# Patient Record
Sex: Male | Born: 1965 | Race: White | Hispanic: No | Smoking: Former smoker
Health system: Southern US, Community
[De-identification: ages and names within clinical notes are randomized; demographics above are authoritative.]

## PROBLEM LIST (undated history)

## (undated) DIAGNOSIS — E119 Type 2 diabetes mellitus without complications: Secondary | ICD-10-CM

## (undated) DIAGNOSIS — N529 Male erectile dysfunction, unspecified: Secondary | ICD-10-CM

## (undated) DIAGNOSIS — E785 Hyperlipidemia, unspecified: Secondary | ICD-10-CM

## (undated) DIAGNOSIS — I10 Essential (primary) hypertension: Secondary | ICD-10-CM

## (undated) DIAGNOSIS — C61 Malignant neoplasm of prostate: Secondary | ICD-10-CM

## (undated) DIAGNOSIS — D649 Anemia, unspecified: Secondary | ICD-10-CM

## (undated) DIAGNOSIS — K219 Gastro-esophageal reflux disease without esophagitis: Secondary | ICD-10-CM

## (undated) HISTORY — PX: MOUTH SURGERY: SHX715

## (undated) HISTORY — PX: HEMORROIDECTOMY: SUR656

---

## 2011-06-16 ENCOUNTER — Ambulatory Visit: Payer: Self-pay | Admitting: Surgery

## 2017-02-15 ENCOUNTER — Encounter: Payer: Self-pay | Admitting: Emergency Medicine

## 2017-02-15 ENCOUNTER — Emergency Department: Payer: Worker's Compensation

## 2017-02-15 ENCOUNTER — Emergency Department
Admission: EM | Admit: 2017-02-15 | Discharge: 2017-02-15 | Disposition: A | Discharge status: Home / Self Care | Payer: Worker's Compensation | Attending: Emergency Medicine | Admitting: Emergency Medicine

## 2017-02-15 DIAGNOSIS — S0990XA Unspecified injury of head, initial encounter: Secondary | ICD-10-CM | POA: Diagnosis present

## 2017-02-15 DIAGNOSIS — Y929 Unspecified place or not applicable: Secondary | ICD-10-CM | POA: Insufficient documentation

## 2017-02-15 DIAGNOSIS — S0083XA Contusion of other part of head, initial encounter: Secondary | ICD-10-CM | POA: Diagnosis not present

## 2017-02-15 DIAGNOSIS — W228XXA Striking against or struck by other objects, initial encounter: Secondary | ICD-10-CM | POA: Diagnosis not present

## 2017-02-15 DIAGNOSIS — E119 Type 2 diabetes mellitus without complications: Secondary | ICD-10-CM | POA: Insufficient documentation

## 2017-02-15 DIAGNOSIS — I10 Essential (primary) hypertension: Secondary | ICD-10-CM | POA: Diagnosis not present

## 2017-02-15 DIAGNOSIS — F17200 Nicotine dependence, unspecified, uncomplicated: Secondary | ICD-10-CM | POA: Diagnosis not present

## 2017-02-15 DIAGNOSIS — S0081XA Abrasion of other part of head, initial encounter: Secondary | ICD-10-CM | POA: Diagnosis not present

## 2017-02-15 DIAGNOSIS — Y990 Civilian activity done for income or pay: Secondary | ICD-10-CM | POA: Diagnosis not present

## 2017-02-15 DIAGNOSIS — Y9389 Activity, other specified: Secondary | ICD-10-CM | POA: Insufficient documentation

## 2017-02-15 HISTORY — DX: Essential (primary) hypertension: I10

## 2017-02-15 HISTORY — DX: Type 2 diabetes mellitus without complications: E11.9

## 2017-02-15 MED ORDER — HYDROCODONE-ACETAMINOPHEN 5-325 MG PO TABS: 1.0000 | ORAL_TABLET | ORAL | 0 refills | Status: DC | PRN

## 2017-02-15 NOTE — ED Notes (Signed)
See triage note. States he was hit in the head with heavy chain at work  Swelling noted to forehead with some small abrasions noted

## 2017-02-15 NOTE — Discharge Instructions (Signed)
Continue to use ice to your forehead to reduce swelling. Watch patient's abrasions for infection. Take Tylenol tonight as needed for headache or pain. Read information about head injuries and also have the persons staying with you read about head injuries also. Return to the emergency room immediately if any worsening of your symptoms such as slurred speech, projectile vomiting, visual changes, no change in personality. You may take Norco if needed for severe pain beginning tomorrow if needed.

## 2017-02-15 NOTE — ED Triage Notes (Addendum)
Pt was hit in head by large chain. Pt reports chain had pressure/tnesion on it when came and hit him.    Swelling noted to forehead.  Did have some nausea, no vomiting.  Small open wound to forehead with bleeding controlled.  No LOC per pt.  Felt like "woozy" at time of event but feels better now.  Was ambulatory after

## 2017-02-15 NOTE — ED Provider Notes (Signed)
[facility] Emergency Department Provider Note  ____________________________________________   First MD Initiated Contact with Patient 02/15/17 1304     (approximate)  I have reviewed the triage vital signs and the nursing notes.   HISTORY  Chief Complaint Head Injury    HPI Kevin Holder is a 51 y.o. male is here after being injured at work. Patient states that he was hit in the head with a large chain that broke. He states that they were lifting equipment with this chain when it snapped and hit him. He denies any loss of consciousness. He denies any nausea, vomiting or visual changes. He states initially he felt "woozy" but has started feeling better. He does have some open wounds on his face. He has had a tetanus shot within last 3 years. Patient rates pain as a 5/10.    Past Medical History:  Diagnosis Date  . Diabetes mellitus without complication ([place])   . Hypertension     There are no active problems to display for this patient.   Past Surgical History:  Procedure Laterality Date  . HEMORROIDECTOMY      Prior to Admission medications   Medication Sig Start Date End Date Taking? Authorizing Provider  HYDROcodone-acetaminophen (NORCO/VICODIN) 5-325 MG tablet Take 1 tablet by mouth every 4 (four) hours as needed for moderate pain. 02/15/17   Johnn Hai, PA-C    Allergies Patient has no known allergies.  History reviewed. No pertinent family history.  Social History Social History  Substance Use Topics  . Smoking status: Current Some Day Smoker  . Smokeless tobacco: Current User  . Alcohol use Yes    Review of Systems Constitutional: No fever/chills Eyes: No visual changes. ENT: Abrasions to the nose. No visual changes. Cardiovascular: Denies chest pain. Respiratory: Denies shortness of breath. Gastrointestinal:   No nausea, no vomiting.   Skin: Positive abrasion forehead and bridge of the nose. Neurological: Negative  for headaches, focal weakness or numbness.   ____________________________________________   PHYSICAL EXAM:  VITAL SIGNS: ED Triage Vitals  Enc Vitals Group     BP 02/15/17 1147 (!) 152/82     Pulse Rate 02/15/17 1147 84     Resp 02/15/17 1147 18     Temp 02/15/17 1147 99 F (37.2 C)     Temp Source 02/15/17 1147 Oral     SpO2 02/15/17 1147 99 %     Weight 02/15/17 1148 240 lb (108.9 kg)     Height 02/15/17 1148 6\' 1"  (1.854 m)     Head Circumference --      Peak Flow --      Pain Score 02/15/17 1147 5     Pain Loc --      Pain Edu? --      Excl. in [place]? --     Constitutional: Alert and oriented. Well appearing and in no acute distress. Eyes: Conjunctivae are normal. PERRL. EOMI. Head: Atraumatic. Nose: No congestion/rhinnorhea. Neck: No stridor.  No tenderness on palpation cervical spine. Cardiovascular: Normal rate, regular rhythm. Grossly normal heart sounds.  Good peripheral circulation. Respiratory: Normal respiratory effort.  No retractions. Lungs CTAB. Musculoskeletal: Moves upper and lower extremities without difficulty and patient was ambulatory while in the department. Neurologic:  Normal speech and language. No gross focal neurologic deficits are appreciated. No gait instability. Skin:  Skin is warm, dry.  There are several superficial linear abrasions across the bridge of the nose without any active bleeding. There is one vertical  abrasion measuring approximately 1 cm on the left forehead with soft tissue swelling. No active bleeding was noted. Psychiatric: Mood and affect are normal. Speech and behavior are normal.  ____________________________________________   LABS (all labs ordered are listed, but only abnormal results are displayed)  Labs Reviewed - No data to display ____________________________________________   RADIOLOGY  Ct Head Wo Contrast  Result Date: 02/15/2017 CLINICAL DATA:  Struck in the head by a chain.  Forehead swelling. EXAM: CT HEAD  WITHOUT CONTRAST TECHNIQUE: Contiguous axial images were obtained from the base of the skull through the vertex without intravenous contrast. COMPARISON:  None. FINDINGS: Brain: No evidence of malformation, atrophy, old or acute small or large vessel infarction, mass lesion, hemorrhage, hydrocephalus or extra-axial collection. No evidence of pituitary lesion. Vascular: No vascular calcification.  No hyperdense vessels. Skull: Normal.  No fracture or focal bone lesion. Sinuses/Orbits: Visualized sinuses are clear. No fluid in the middle ears or mastoids. Visualized orbits are normal. Other: Pronounced forehead soft tissue swelling but without underlying calvarial abnormality. IMPRESSION: Pronounced forehead soft tissue swelling. No underlying skull fracture or fluid in the sinuses. No intracranial abnormality. No radiopaque foreign object. Electronically Signed   By: Nelson Chimes M.D.   On: 02/15/2017 12:29    ____________________________________________   PROCEDURES  Procedure(s) performed: None  Procedures  Critical Care performed: No  ____________________________________________   INITIAL IMPRESSION / ASSESSMENT AND PLAN / ED COURSE  Pertinent labs & imaging results that were available during my care of the patient were reviewed by me and considered in my medical decision making (see chart for details).  Discussed head injuries with patient and family. Patient is to take Tylenol if needed for headache for today. His continue using ice to reduce swelling on his forehead. He is watching abrasions for signs of infection. He was given a prescription for Norco if needed for pain or headache tomorrow. We discussed symptoms and signs for immediate return to the emergency room tonight if any problems. Patient was given a note for light duty for the next several days. CT scan was reassuring and patient was made aware.      ____________________________________________   FINAL CLINICAL  IMPRESSION(S) / ED DIAGNOSES  Final diagnoses:  Contusion of forehead, initial encounter  Abrasion of face, initial encounter      NEW MEDICATIONS STARTED DURING THIS VISIT:  Discharge Medication List as of 02/15/2017  1:39 PM    START taking these medications   Details  HYDROcodone-acetaminophen (NORCO/VICODIN) 5-325 MG tablet Take 1 tablet by mouth every 4 (four) hours as needed for moderate pain., Starting Tue 02/15/2017, Print         Note:  This document was prepared using Dragon voice recognition software and may include unintentional dictation errors.    Johnn Hai, PA-C 02/15/17 1543    Earleen Newport, MD 02/17/17 [phone]

## 2018-08-05 IMAGING — CT CT HEAD W/O CM
3 series · 15 of 47 positions shown, 18 images · non-contrast
Comparison: None.

CLINICAL DATA: Struck in the head by a chain.  Forehead swelling.

EXAM:
CT HEAD WITHOUT CONTRAST
TECHNIQUE: Contiguous axial images were obtained from the base of the skull
through the vertex without intravenous contrast.

[Series 2: head wo · axial · 0.47mm/px · z∈[+255,+390]mm · 9 of 33 slices shown, 12 images]
[im 3/33  brain]
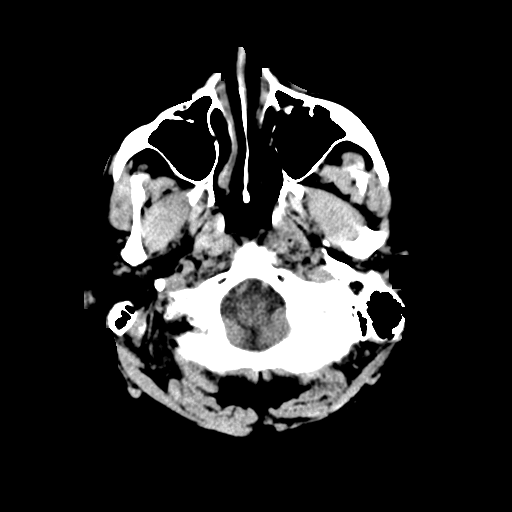
[im 3/33  bone]
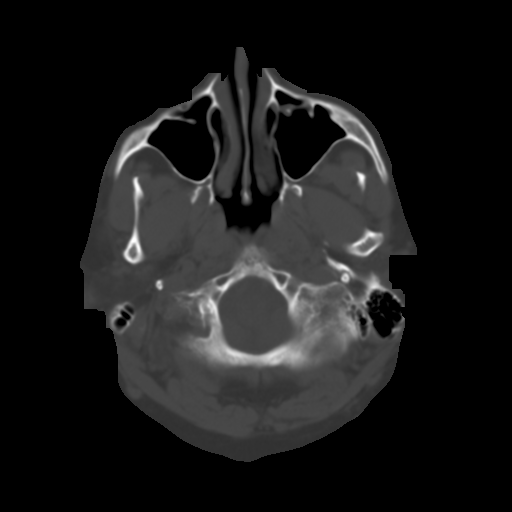
[im 6/33  brain]
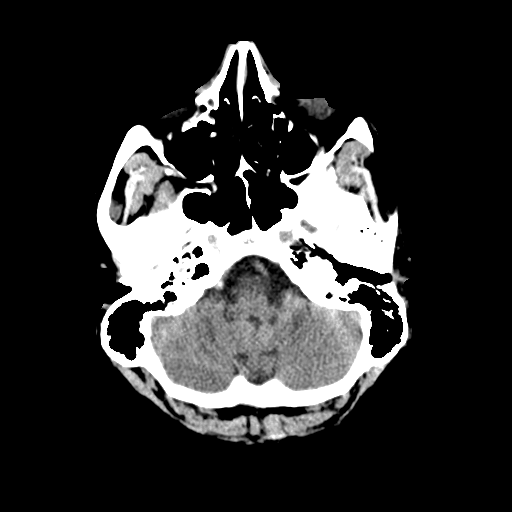
[im 9/33  brain]
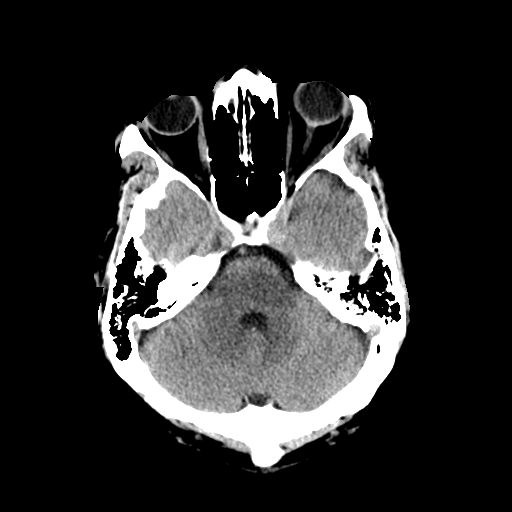
[im 13/33  brain]
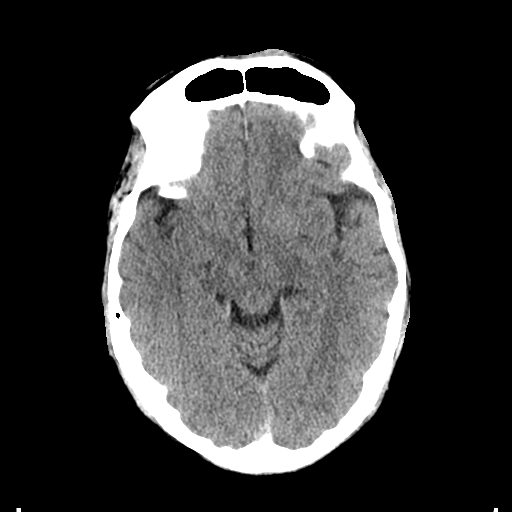
[im 17/33  brain]
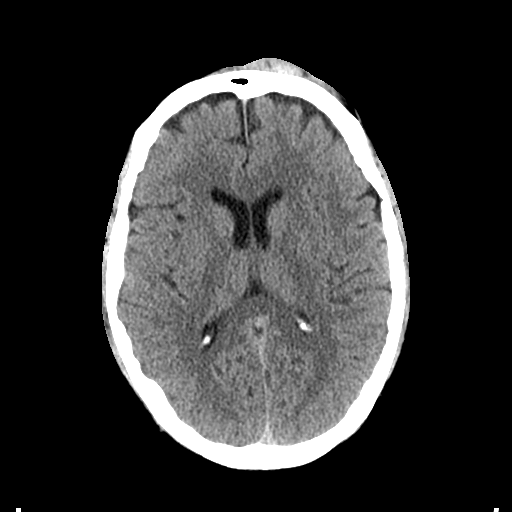
[im 17/33  bone]
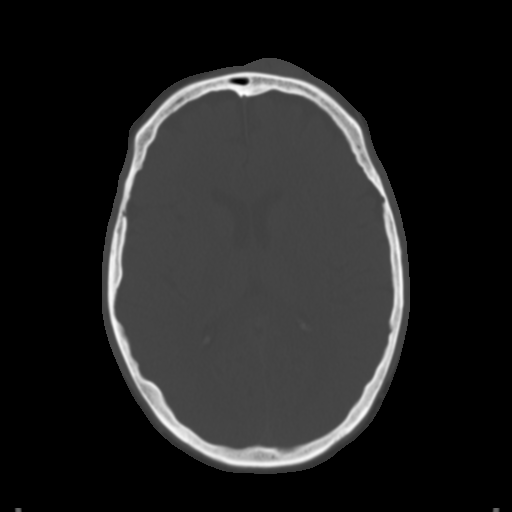
[im 20/33  brain]
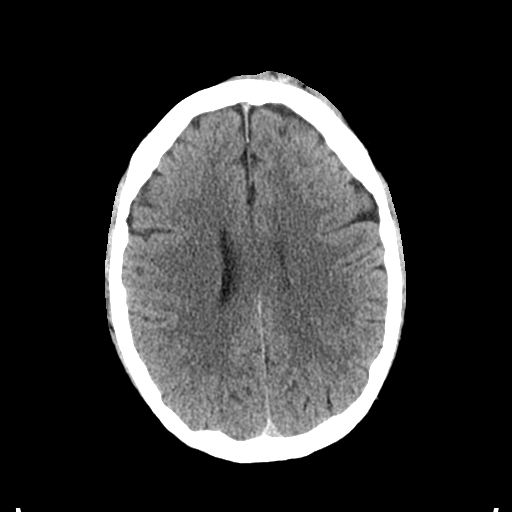
[im 24/33  brain]
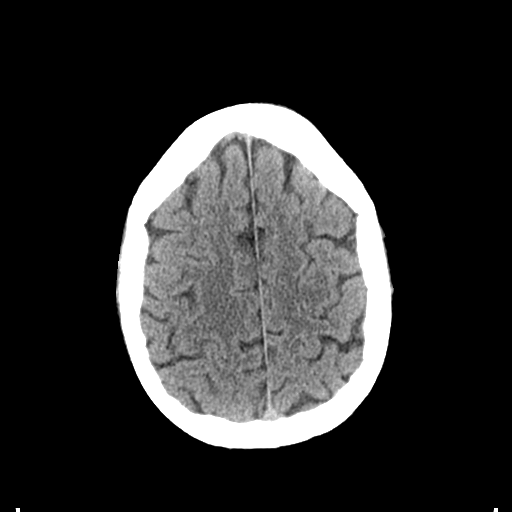
[im 27/33  brain]
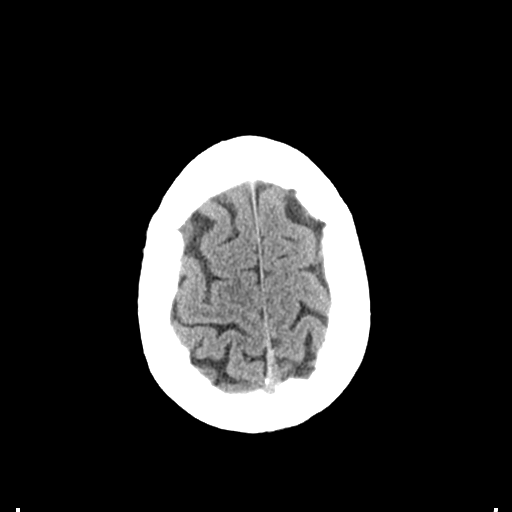
[im 30/33  brain]
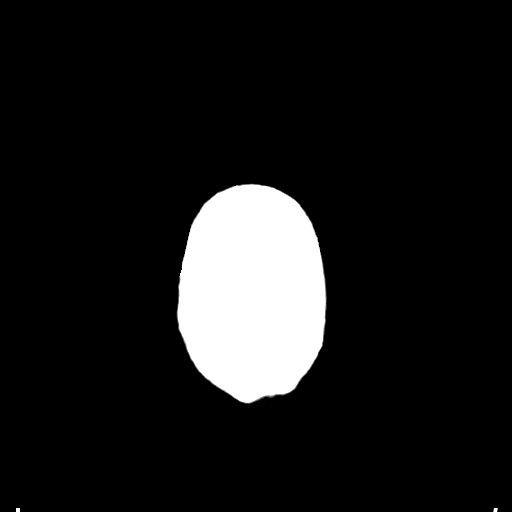
[im 30/33  bone]
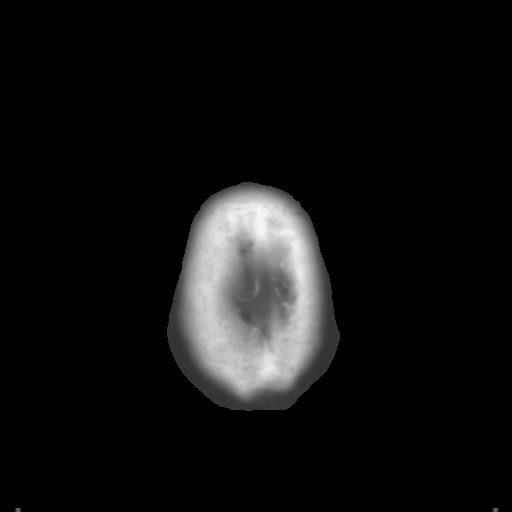

[Series 4: coronal soft tissue · coronal · 0.33mm/px · 3 of 70 slices shown]
[im 24/70  brain]
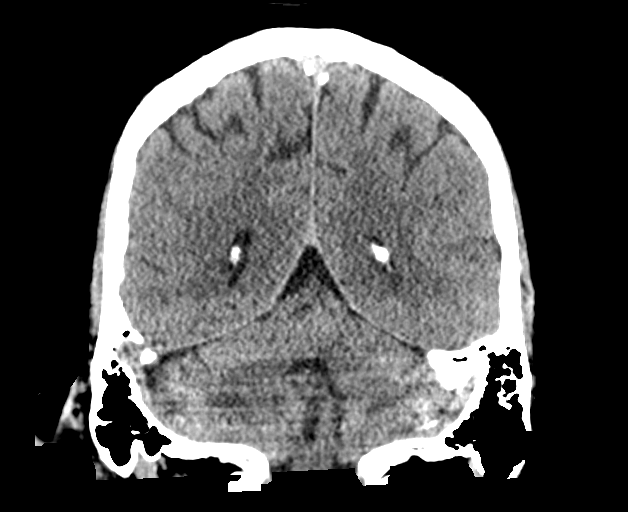
[im 31/70  brain]
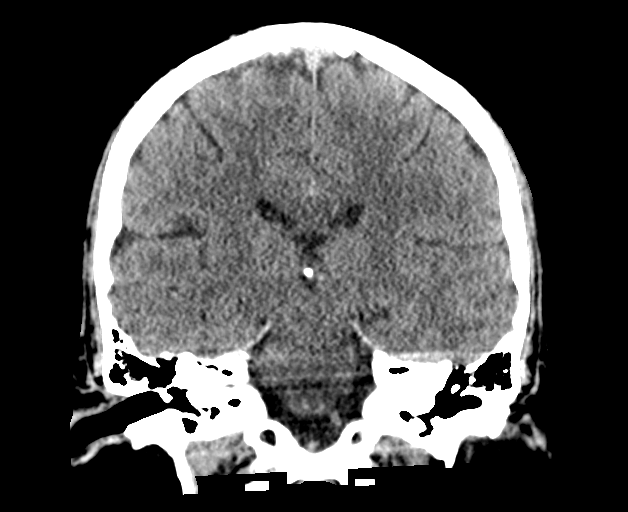
[im 39/70  brain]
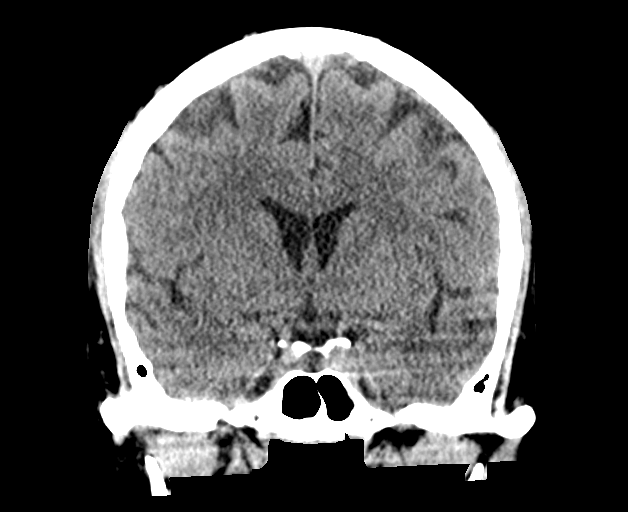

[Series 5: sagittal soft tissue · sagittal · 0.33mm/px · 3 of 52 slices shown]
[im 18/52  brain]
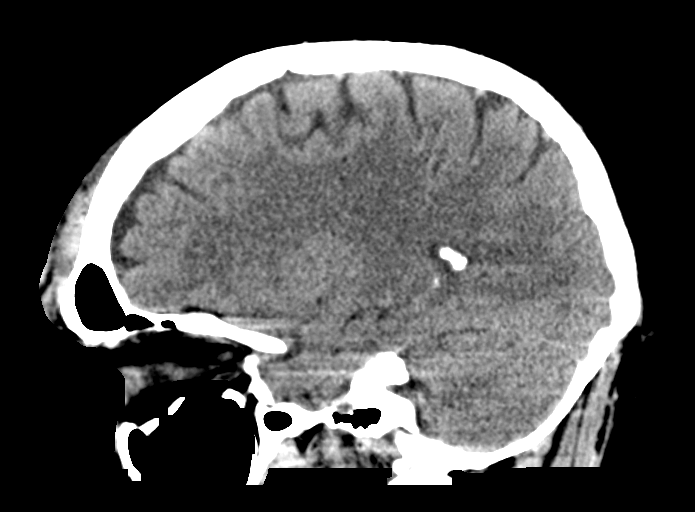
[im 26/52  brain]
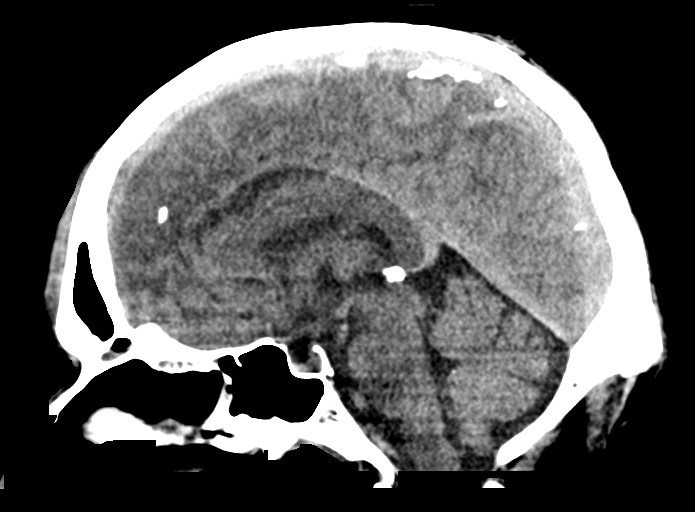
[im 35/52  brain]
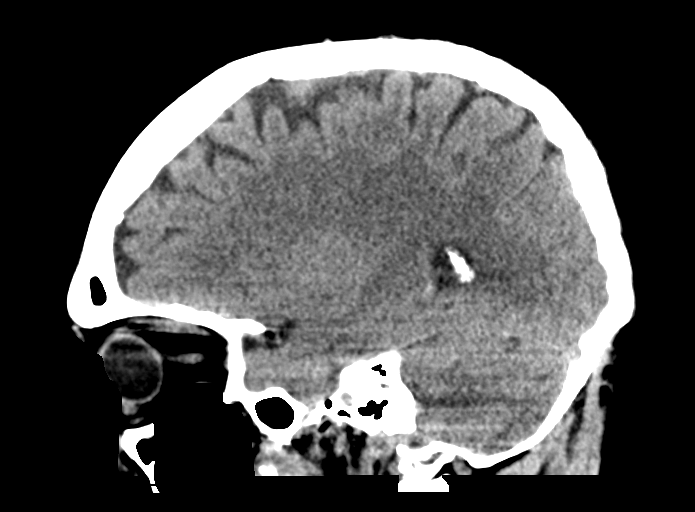

[15 of 47 positions shown; findings below may reference images not displayed]

FINDINGS: Brain: No evidence of malformation, atrophy, old or acute small or
large vessel infarction, mass lesion, hemorrhage, hydrocephalus or
extra-axial collection. No evidence of pituitary lesion.

Vascular: No vascular calcification.  No hyperdense vessels.

Skull: Normal.  No fracture or focal bone lesion.

Sinuses/Orbits: Visualized sinuses are clear. No fluid in the middle
ears or mastoids. Visualized orbits are normal.

Other: Pronounced forehead soft tissue swelling but without
underlying calvarial abnormality.
IMPRESSION: Pronounced forehead soft tissue swelling. No underlying skull
fracture or fluid in the sinuses. No intracranial abnormality. No
radiopaque foreign object.

## 2019-11-08 ENCOUNTER — Ambulatory Visit: Payer: Self-pay | Attending: Internal Medicine

## 2019-11-08 DIAGNOSIS — Z23 Encounter for immunization: Secondary | ICD-10-CM

## 2019-11-08 NOTE — Progress Notes (Signed)
   Covid-19 Vaccination Clinic  Name:  Kevin Holder    MRN: [id number] DOB: 07/26/1966  11/08/2019  Mr. Kevin Holder was observed post Covid-19 immunization for 15 minutes without incident. He was provided with Vaccine Information Sheet and instruction to access the V-Safe system.   Mr. Kevin Holder was instructed to call 911 with any severe reactions post vaccine: Marland Kitchen Difficulty breathing  . Swelling of face and throat  . A fast heartbeat  . A bad rash all over body  . Dizziness and weakness   Immunizations Administered    Name Date Dose VIS Date Route   Pfizer COVID-19 Vaccine 11/08/2019  9:41 AM 0.3 mL 08/03/2019 Intramuscular   Manufacturer: [place]   Lot: [id number]   [place]: [id number]

## 2019-12-04 ENCOUNTER — Ambulatory Visit: Payer: Self-pay | Attending: Internal Medicine

## 2019-12-04 DIAGNOSIS — Z23 Encounter for immunization: Secondary | ICD-10-CM

## 2019-12-04 NOTE — Progress Notes (Signed)
   Covid-19 Vaccination Clinic  Name:  Kevin Holder    MRN: [id number] DOB: August 31, 1965  12/04/2019  [name] was observed post Covid-19 immunization for 15 minutes without incident. He was provided with Vaccine Information Sheet and instruction to access the V-Safe system.   [name] was instructed to call 911 with any severe reactions post vaccine: Marland Kitchen Difficulty breathing  . Swelling of face and throat  . A fast heartbeat  . A bad rash all over body  . Dizziness and weakness   Immunizations Administered    Name Date Dose VIS Date Route   Pfizer COVID-19 Vaccine 12/04/2019  2:02 PM 0.3 mL 08/03/2019 Intramuscular   Manufacturer: [place]   Lot: [id number]   [place]: [id number]

## 2022-08-10 ENCOUNTER — Encounter: Payer: Self-pay | Admitting: Urology

## 2022-08-10 ENCOUNTER — Ambulatory Visit: Payer: BC Managed Care – PPO | Admitting: Urology

## 2022-08-10 VITALS — BP 136/79 | HR 64 | Ht 73.0 in | Wt 230.0 lb

## 2022-08-10 DIAGNOSIS — R972 Elevated prostate specific antigen [PSA]: Secondary | ICD-10-CM

## 2022-08-10 NOTE — Progress Notes (Signed)
   08/10/22 2:04 PM   Kevin Holder December 06, 1965 [id number]  CC: Elevated PSA  HPI: I saw Kevin Holder and his daughter today for elevated PSA.  She works as a Marine scientist at [employer].  He has diabetes with recent hemoglobin A1c of 9.  He has had a persistently rising PSA over the last few years including 2.59 in January 2020, 4 in May 2022, 6.1 in February 2023, and most recently 7.85 in December 2023.  He has never been evaluated by urologist before.  He denies any urinary symptoms or gross hematuria.  No family history of prostate or breast cancer.  No prior cross-sectional imaging to review.   PMH: Past Medical History:  Diagnosis Date   Diabetes mellitus without complication ([place])    Hypertension     Surgical History: Past Surgical History:  Procedure Laterality Date   HEMORROIDECTOMY       Social History:  reports that he has been smoking. He uses smokeless tobacco. He reports current alcohol use. He reports that he does not use drugs.  Physical Exam: BP 136/79 (BP Location: Left Arm, Patient Position: Sitting, Cuff Size: Large)   Pulse 64   Ht '6\' 1"'$  (1.854 m)   Wt 230 lb (104.3 kg)   BMI 30.34 kg/m    Constitutional:  Alert and oriented, No acute distress. Cardiovascular: No clubbing, cyanosis, or edema. Respiratory: Normal respiratory effort, no increased work of breathing. GI: Abdomen is soft, nontender, nondistended, no abdominal masses DRE: 40 g, smooth, no nodules or masses  Laboratory Data: See HPI for PSA history  Pertinent Imaging: None to review  Assessment & Plan:   56 year old male with poorly controlled diabetes and persistently rising PSA over the last 3 years, most recently 7.85.  We reviewed the implications of an elevated PSA and the uncertainty surrounding it. In general, a man's PSA increases with age and is produced by both normal and cancerous prostate tissue. The differential diagnosis for elevated PSA includes BPH, prostate cancer, infection,  recent intercourse/ejaculation, recent urethroscopic manipulation (foley placement/cystoscopy) or trauma, and prostatitis.   Management of an elevated PSA can include observation or prostate biopsy and we discussed this in detail. Our goal is to detect clinically significant prostate cancers, and manage with either active surveillance, surgery, or radiation for localized disease. Risks of prostate biopsy include bleeding, infection (including life threatening sepsis), pain, and lower urinary symptoms. Hematuria, hematospermia, and blood in the stool are all common after biopsy and can persist up to 4 weeks.  We discussed other options like prostate MRI.  He is very averse to biopsy and would like to start with prostate MRI, but understands need for biopsy if prostate MRI shows any abnormalities.  Prostate MRI, call with results, if any abnormalities will schedule prostate biopsy   Nickolas Madrid, MD 08/10/2022  [facility] [address], [place], [place] [address] [phone]

## 2022-08-10 NOTE — Patient Instructions (Addendum)
Prostate Cancer Screening  Prostate cancer screening is testing that is done to check for the presence of prostate cancer in men. The prostate gland is a walnut-sized gland that is located below the bladder and in front of the rectum in males. The function of the prostate is to add fluid to semen during ejaculation. Prostate cancer is one of the most common types of cancer in men. Who should have prostate cancer screening? Screening recommendations vary based on age and other risk factors, as well as between the professional organizations who make the recommendations. In general, screening is recommended if: You are age 50 to 70 and have an average risk for prostate cancer. You should talk with your health care provider about your need for screening and how often screening should be done. Because most prostate cancers are slow growing and will not cause death, screening in this age group is generally reserved for men who have a 10- to 15-year life expectancy. You are younger than age 50, and you have these risk factors: Having a father, brother, or uncle who has been diagnosed with prostate cancer. The risk is higher if your family member's cancer occurred at an early age or if you have multiple family members with prostate cancer at an early age. Being a male who is Black or is of Caribbean or sub-Saharan African descent. In general, screening is not recommended if: You are younger than age 40. You are between the ages of 40 and 49 and you have no risk factors. You are 70 years of age or older. At this age, the risks that screening can cause are greater than the benefits that it may provide. If you are at high risk for prostate cancer, your health care provider may recommend that you have screenings more often or that you start screening at a younger age. How is screening for prostate cancer done? The recommended prostate cancer screening test is a blood test called the prostate-specific antigen  (PSA) test. PSA is a protein that is made in the prostate. As you age, your prostate naturally produces more PSA. Abnormally high PSA levels may be caused by: Prostate cancer. An enlarged prostate that is not caused by cancer (benign prostatic hyperplasia, or BPH). This condition is very common in older men. A prostate gland infection (prostatitis) or urinary tract infection. Certain medicines such as male hormones (like testosterone) or other medicines that raise testosterone levels. A rectal exam may be done as part of prostate cancer screening to help provide information about the size of your prostate gland. When a rectal exam is performed, it should be done after the PSA level is drawn to avoid any effect on the results. Depending on the PSA results, you may need more tests, such as: A physical exam to check the size of your prostate gland, if not done as part of screening. Blood and imaging tests. A procedure to remove tissue samples from your prostate gland for testing (biopsy). This is the only way to know for certain if you have prostate cancer. What are the benefits of prostate cancer screening? Screening can help to identify cancer at an early stage, before symptoms start and when the cancer can be treated more easily. There is a small chance that screening may lower your risk of dying from prostate cancer. The chance is small because prostate cancer is a slow-growing cancer, and most men with prostate cancer die from a different cause. What are the risks of prostate cancer screening? The   main risk of prostate cancer screening is diagnosing and treating prostate cancer that would never have caused any symptoms or problems. This is called overdiagnosisand overtreatment. PSA screening cannot tell you if your PSA is high due to cancer or a different cause. A prostate biopsy is the only procedure to diagnose prostate cancer. Even the results of a biopsy may not tell you if your cancer needs to  be treated. Slow-growing prostate cancer may not need any treatment other than monitoring, so diagnosing and treating it may cause unnecessary stress or other side effects. Questions to ask your health care provider When should I start prostate cancer screening? What is my risk for prostate cancer? How often do I need screening? What type of screening tests do I need? How do I get my test results? What do my results mean? Do I need treatment? Where to find more information The American Cancer Society: www.cancer.org American Urological Association: www.auanet.org Contact a health care provider if: You have difficulty urinating. You have pain when you urinate or ejaculate. You have blood in your urine or semen. You have pain in your back or in the area of your prostate. Summary Prostate cancer is a common type of cancer in men. The prostate gland is located below the bladder and in front of the rectum. This gland adds fluid to semen during ejaculation. Prostate cancer screening may identify cancer at an early stage, when the cancer can be treated more easily and is less likely to have spread to other areas of the body. The prostate-specific antigen (PSA) test is the recommended screening test for prostate cancer, but it has associated risks. Discuss the risks and benefits of prostate cancer screening with your health care provider. If you are age 62 or older, the risks that screening can cause are greater than the benefits that it may provide. This information is not intended to replace advice given to you by your health care provider. Make sure you discuss any questions you have with your health care provider. Document Revised: 02/02/2021 Document Reviewed: 02/02/2021 Elsevier Patient Education  [place] Antigen Test Why am I having this test? The prostate-specific antigen (PSA) test is a screening test for prostate cancer. It can identify early signs of  prostate cancer, which may allow for early detection and more effective treatment. Your health care provider may recommend that you have a PSA test starting at age 30 or that you have one earlier if you are at higher risk for prostate cancer. You may also have a PSA test: To monitor treatment of prostate cancer. To check whether prostate cancer has returned after treatment. What is being tested? This test measures the amount of PSA in your blood. PSA is a protein that is made in the prostate. The prostate naturally produces more PSA as you age, but very high levels may be a sign of a medical condition. What kind of sample is taken?  A blood sample is required for this test. It is usually collected by inserting a needle into a blood vessel but can also be collected by sticking a finger with a small needle. Blood for this test should be drawn before having an exam of the prostate that involves digital rectal examination to avoid affecting the results. How do I prepare for this test? Do not ejaculate starting 24 hours before your test, or as long as told by your health care provider, as this can cause an elevation in PSA. Do not undergo  any procedures that require manipulation of the prostate, such as biopsy or surgery, for 6 weeks before the test is done as this can cause an elevation in PSA. Tell a health care provider about: Any signs you may have of other conditions that can affect PSA levels, such as: An enlarged prostate that is not caused by cancer (benign prostatic hyperplasia, or BPH). This condition is very common in older men. A prostate or urinary tract infection. Any allergies you have. All medicines you are taking, including vitamins, herbs, eye drops, creams, and over-the-counter medicines. This also includes: Medicines to assist with hair growth, such as finasteride. Any recent exposure to a medicine called diethylstilbestrol (DES). Medicines such as male hormones (like testosterone)  or other medicines that raise testosterone levels. Any bleeding problems you have. Any recent procedures you have had, especially any procedures involving the prostate or rectum. Any medical conditions you have. How are the results reported? Your test results will be reported as a value that indicates how much PSA is in your blood. This will be given as nanograms of PSA per milliliter of blood (ng/mL). Your health care provider will compare your results to normal ranges that were established after testing a large group of people (reference ranges). Reference ranges may vary among labs and hospitals. PSA levels vary from person to person and generally increase with age. Because of this variation, there is no single PSA value that is considered normal for everyone. Instead, PSA reference ranges are used to describe whether your PSA levels are considered low or high (elevated). Common reference ranges are: Low: 0-2.5 ng/mL. Slightly to moderately elevated: 2.6-10.0 ng/mL. Moderately elevated: 10.0-19.9 ng/mL. Significantly elevated: 20 ng/mL or greater. What do the results mean? A test result that is higher than 4 ng/mL may mean that you have prostate cancer. However, a PSA test by itself is not enough to diagnose prostate cancer. High PSA levels may also be caused by the natural aging process, prostate infection (prostatitis), or BPH. PSA screening cannot tell you if your PSA is high due to cancer or a different cause. A prostate biopsy is the only way to diagnose prostate cancer. A risk of having the PSA test is diagnosing and treating prostate cancer that would never have caused any symptoms or problems (overdiagnosis and overtreatment). Talk with your health care provider about what your results mean. In some cases, your health care provider may do more testing to confirm the results. Questions to ask your health care provider Ask your health care provider, or the department that is doing the  test: When will my results be ready? How will I get my results? What are my treatment options? What other tests do I need? What are my next steps? Summary The prostate-specific antigen (PSA) test is a screening test for prostate cancer. Your health care provider may recommend that you have a PSA test starting at age 46 or that you have one earlier if you are at higher risk for prostate cancer. A test result that is higher than 4 ng/mL may mean that you have prostate cancer. However, elevated levels can be caused by a number of conditions other than prostate cancer. Talk with your health care provider about what your results mean. This information is not intended to replace advice given to you by your health care provider. Make sure you discuss any questions you have with your health care provider. Document Revised: 12/17/2020 Document Reviewed: 12/17/2020 Elsevier Patient Education  [place].

## 2022-08-31 ENCOUNTER — Ambulatory Visit
Admission: RE | Admit: 2022-08-31 | Discharge: 2022-08-31 | Disposition: A | Discharge status: Home / Self Care | Payer: BC Managed Care – PPO | Source: Ambulatory Visit | Attending: Urology | Admitting: Urology

## 2022-08-31 DIAGNOSIS — R972 Elevated prostate specific antigen [PSA]: Secondary | ICD-10-CM | POA: Diagnosis present

## 2022-08-31 MED ORDER — GADOBUTROL 1 MMOL/ML IV SOLN
10.0000 mL | Freq: Once | INTRAVENOUS | Status: AC | PRN
  Administered 2022-08-31: 10 mL via INTRAVENOUS

## 2022-09-03 ENCOUNTER — Telehealth: Payer: Self-pay

## 2022-09-03 NOTE — Telephone Encounter (Signed)
Called pt, no answer. Unable to leave message as no voicemail is set up. 1st attempt.

## 2022-09-03 NOTE — Telephone Encounter (Signed)
-----  Message from Billey Co, MD sent at 09/02/2022 12:44 PM EST ----- Prostate MRI does show a suspicious lesion that will require biopsy.  Please place referral to [facility] for MRI fusion biopsy, and will follow-up with me in [place] or [place] to review those results  Nickolas Madrid, MD 09/02/2022

## 2022-09-06 NOTE — Telephone Encounter (Signed)
Called pt's daughter per DPR, informed her of the information below. Daughter voiced understanding and will contact patient.

## 2022-09-16 ENCOUNTER — Other Ambulatory Visit: Payer: Self-pay

## 2022-09-16 DIAGNOSIS — R972 Elevated prostate specific antigen [PSA]: Secondary | ICD-10-CM

## 2022-10-12 ENCOUNTER — Ambulatory Visit (INDEPENDENT_AMBULATORY_CARE_PROVIDER_SITE_OTHER): Payer: BC Managed Care – PPO | Admitting: Urology

## 2022-10-12 ENCOUNTER — Encounter: Payer: Self-pay | Admitting: Urology

## 2022-10-12 VITALS — BP 160/80 | HR 92 | Ht 73.0 in | Wt 230.0 lb

## 2022-10-12 DIAGNOSIS — C61 Malignant neoplasm of prostate: Secondary | ICD-10-CM

## 2022-10-12 NOTE — Progress Notes (Signed)
   10/12/2022 2:14 PM   Kevin Holder 12-Jan-1966 [id number]  Reason for visit: Review prostate biopsy results, new diagnosis of prostate cancer  HPI: 57 year old male with an elevated PSA of 7.85 that has continued to rise over the last few years.  Using shared decision making, he opted for a prostate MRI, which showed 43 g prostate with PI-RADS 5 lesion in the right anterior stroma and transition zone in the mid gland and apex, no evidence of metastatic disease.  He underwent a fusion biopsy in [place] which showed negative cores in the ROI, however single core on the right side of Gleason score 3+4=7 disease with 20% max core involvement, and 2 additional cores of 3+3=6.  He has diabetes with a hemoglobin A1c of 9 but is otherwise relatively healthy.  Denies any significant urinary symptoms at baseline, very mild ED, occasionally will take medications.  No prior abdominal surgeries.  We had a lengthy conversation today about the patient's new diagnosis of prostate cancer.  We reviewed the risk classifications per the AUA guidelines including very low risk, low risk, intermediate risk, and high risk disease, and the need for additional staging imaging with CT and bone scan in patients with unfavorable intermediate risk and high risk disease.  I explained that his life expectancy, clinical stage, Gleason score, PSA, and other co-morbidities influence treatment strategies.  We discussed the roles of active surveillance, radiation therapy, surgical therapy with robotic prostatectomy, and hormone therapy with androgen deprivation.  We discussed that patients urinary symptoms also impact treatment strategy, as patients with severe lower urinary tract symptoms may have significant worsening or even develop urinary retention after undergoing radiation.  In regards to surgery, we discussed robotic prostatectomy +/- lymphadenectomy at length.  The procedure takes 3 to 4 hours, and patient's typically  discharge home on post-op day #1.  A Foley catheter is left in place for 7 to 10 days to allow for healing of the vesicourethral anastomosis.  There is a small risk of bleeding, infection, damage to surrounding structures or bowel, hernia, DVT/PE, or serious cardiac or pulmonary complications.  We discussed at length post-op side effects including erectile dysfunction, and the importance of pre-operative erectile function on long-term outcomes.  Even with a nerve sparing approach, there is an approximately 25% rate of permanent erectile dysfunction.  We also discussed postop urinary incontinence at length.  We expect patients to have stress incontinence post-operatively that will improve over period of weeks to months.  Less than 10% of men will require a pad at 1 year after surgery.  Patients will need to avoid heavy lifting and strenuous activity for 3 to 4 weeks, but most men return to their baseline activity status by 6 weeks.  In summary, Kevin Holder is a 57 y.o. man with newly diagnosed favorable intermediate risk prostate cancer. He would like to pursue brachytherapy.  Referral was placed to radiation oncology to discuss radiation options.   I spent 45 total minutes on the day of the encounter including pre-visit review of the medical record, face-to-face time with the patient, and post visit ordering of labs/imaging/tests.  Billey Co, [facility] [address] [place], [place] [address] [phone]

## 2022-10-12 NOTE — Patient Instructions (Signed)
Prostate Cancer  The prostate is a small gland that produces fluid that makes up semen (seminal fluid). It is located below the bladder in men, in front of the rectum. Prostate cancer is the abnormal growth of cells in the prostate gland. What are the causes? The exact cause of this condition is not known. What increases the risk? You are more likely to develop this condition if: You are 58 years of age or older. You have a family history of prostate cancer. You have a family history of breast and ovarian cancer. You have genes that are passed from parent to child (inherited), such as BRCA1 and BRCA2. You have Lynch syndrome. African American men and men of African descent are diagnosed with prostate cancer at higher rates than other men. The reasons for this are not well understood and are likely due to a combination of genetic and environmental factors. What are the signs or symptoms? Symptoms of this condition include: Problems with urination. This may include: A weak or interrupted flow of urine. Trouble starting or stopping urination. Trouble emptying the bladder all the way. The need to urinate more often, especially at night. Blood in urine or semen. Persistent pain or discomfort in the lower back, lower abdomen, or hips. Trouble getting an erection. Weakness or numbness in the legs or feet. How is this diagnosed? This condition can be diagnosed with: A digital rectal exam. For this exam, a health care provider inserts a gloved finger into the rectum to feel the prostate gland. A blood test called a prostate-specific antigen (PSA) test. A procedure in which a sample of tissue is taken from the prostate and checked under a microscope (prostate biopsy). An imaging test called transrectal ultrasonography. Once the condition is diagnosed, tests will be done to determine how far the cancer has spread. This is called staging the cancer. Staging may involve imaging tests, such as a bone  scan, CT scan, PET scan, or MRI. Stages of prostate cancer The stages of prostate cancer are as follows: Stage 1 (I). At this stage, the cancer is found in the prostate only. The cancer is not visible on imaging tests, and it is usually found by accident, such as during prostate surgery. Stage 2 (II). At this stage, the cancer is more advanced than it is in stage 1, but the cancer has not spread outside the prostate. Stage 3 (III). At this stage, the cancer has spread beyond the outer layer of the prostate to nearby tissues. The cancer may be found in the seminal vesicles, which are near the bladder and the prostate. Stage 4 (IV). At this stage, the cancer has spread to other parts of the body, such as the lymph nodes, bones, bladder, rectum, liver, or lungs. Prostate cancer grading Prostate cancer is also graded according to how the cancer cells look under a microscope. This is called the Gleason score and the total score can range from 6-10, indicating how likely it is that the cancer will spread (metastasize) to other parts of the body. The higher the score, the greater the likelihood that the cancer will spread. Gleason 6 or lower: This indicates that the cancer cells look similar to normal prostate cells (well differentiated). Gleason 7: This indicates that the cancer cells look somewhat similar to normal prostate cells (moderately differentiated). Gleason 8, 9, or 10: This indicates that the cancer cells look very different than normal prostate cells (poorly differentiated). How is this treated? Treatment for this condition depends on several   factors, including the stage of the cancer, your age, personal preferences, and your overall health. Talk with your health care provider about treatment options that are recommended for you. Common treatments include: Observation for early stage prostate cancer (active surveillance). This involves having exams, blood tests, and in some cases, more biopsies.  For some men, this is the only treatment needed. Surgery. Types of surgeries include: Open surgery (radical prostatectomy). In this surgery, a larger incision is made to remove the prostate. A laparoscopic radical prostatectomy. This is a surgery to remove the prostate and lymph nodes through several small incisions. It is often referred to as a minimally invasive surgery. A robotic radical prostatectomy. This is laparoscopic surgery to remove the prostate and lymph nodes with the help of robotic arms that are controlled by the surgeon. Cryoablation. This is surgery to freeze and destroy cancer cells. Radiation treatment. Types of radiation treatment include: External beam radiation. This type aims beams of radiation from outside the body at the prostate to destroy cancerous cells. Brachytherapy. This type uses radioactive needles, seeds, wires, or tubes that are implanted into the prostate gland. Like external beam radiation, brachytherapy destroys cancerous cells. An advantage is that this type of radiation limits the damage to surrounding tissue and has fewer side effects. Chemotherapy. This treatment kills cancer cells or stops them from multiplying. It kills both cancer cells and normal cells. Targeted therapy. This treatment uses medicines to kill cancer cells without damaging normal cells. Hormone treatment. This treatment involves taking medicines that act on testosterone, one of the male hormones, by: Stopping your body from producing testosterone. Blocking testosterone from reaching cancer cells. Follow these instructions at home: Lifestyle Do not use any products that contain nicotine or tobacco. These products include cigarettes, chewing tobacco, and vaping devices, such as e-cigarettes. If you need help quitting, ask your health care provider. Eat a healthy diet. To do this: Eat foods that are high in fiber. These include beans, whole grains, and fresh fruits and vegetables. Limit  foods that are high in fat and sugar. These include fried or sweet foods. Treatment for prostate cancer may affect sexual function. If you have a partner, continue to have intimate moments. This may include touching, holding, hugging, and caressing your partner. Get plenty of sleep. Consider joining a support group for men who have prostate cancer. Meeting with a support group may help you learn to manage the stress of having cancer. General instructions Take over-the-counter and prescription medicines only as told by your health care provider. If you have to go to the hospital, notify your cancer specialist (oncologist). Keep all follow-up visits. This is important. Where to find more information American Cancer Society: www.cancer.org American Society of Clinical Oncology: www.cancer.net National Cancer Institute: www.cancer.gov Contact a health care provider if: You have new or increasing trouble urinating. You have new or increasing blood in your urine. You have new or increasing pain in your hips, back, or chest. Get help right away if: You have weakness or numbness in your legs. You cannot control urination or your bowel movements (incontinence). You have chills or a fever. Summary The prostate is a small gland that is involved in the production of semen. It is located below a man's bladder, in front of the rectum. Prostate cancer is the abnormal growth of cells in the prostate gland. Treatment for this condition depends on the stage of the cancer, your age, personal preferences, and your overall health. Talk with your health care provider about   treatment options that are recommended for you. Consider joining a support group for men who have prostate cancer. Meeting with a support group may help you learn to manage the stress of having cancer. This information is not intended to replace advice given to you by your health care provider. Make sure you discuss any questions you have with  your health care provider. Document Revised: 11/05/2020 Document Reviewed: 11/05/2020 Elsevier Patient Education  2023 Else[employer] Inc.  Robot-Assisted Laparoscopic Radical Prostatectomy  Robot-assisted laparoscopic radical prostatectomy is surgery done to remove the entire prostate and nearby tissue. This includes the seminal vesicles, which are near the bladder and the prostate. This procedure is done to treat prostate cancer that has not spread (metastasized) to other parts of the body. The goal of the surgery is to remove all cancer cells to help keep the cancer from metastasizing. During this procedure, the surgeon makes several incisions in the abdomen instead of one large incision. A long, thin, lighted tube with a tiny camera on the end (laparoscope) is put into one of the incisions. This allows the surgeon to see inside the abdomen. Other surgical tools are put in through the other incisions and used to take out the prostate and nearby tissues. The surgeon uses robotic arms to control these tools while sitting at a computer near the operating table. Lymph nodes in the pelvis may also be removed. Lymph nodes are part of the body's disease-fighting system (immune system). When prostate cancer spreads, it tends to go to the lymph nodes in the pelvis first. If the pelvic lymph nodes are removed, they will be checked for cancer cells. Tell a health care provider about: Any allergies you have. All medicines you are taking, including vitamins, herbs, eye drops, creams, and over-the-counter medicines. Any problems you or family members have had with anesthetic medicines. Any bleeding problems you have. Any surgeries you have had. Any medical conditions you have. Any prostate infections you have had. What are the risks? Generally, this is a safe procedure. Still, problems may occur, including: Infection. Bleeding. Allergic reactions to medicines. Damage to nearby structures or organs, such as the  rectum, ureters, urethra, bladder, or small intestine. Blockage (obstruction) of the large or small intestines. Problems that affect urination or sexual function. These may include: Narrowing or scarring of the urethra (stricture), which may block the flow of urine. Inability to control when you urinate (incontinence). Inability to get or keep an erection (erectile dysfunction). Dry ejaculation. This is when no semen comes out during orgasm. The formation of a sac (cyst) in the pelvis that is filled with fluid from the lymph glands (lymphocele). Blood clots in the legs. What happens before the procedure? Staying hydrated Follow instructions from your health care provider about hydration, which may include: Up to 2 hours before the procedure - you may continue to drink clear liquids, such as water, clear fruit juice, black coffee, and plain tea.  Eating and drinking restrictions Follow instructions from your health care provider about eating and drinking, which may include: 8 hours before the procedure - stop eating heavy meals or foods, such as meat, fried foods, or fatty foods. 6 hours before the procedure - stop eating light meals or foods, such as toast or cereal. 6 hours before the procedure - stop drinking milk or drinks that contain milk. 2 hours before the procedure - stop drinking clear liquids. Medicines Ask your health care provider about: Changing or stopping your regular medicines. This is especially important   if you are taking diabetes medicines or blood thinners. Taking medicines such as aspirin and ibuprofen. These medicines can thin your blood. Do not take these medicines unless your health care provider tells you to take them. Taking over-the-counter medicines, vitamins, herbs, and supplements. Follow your health care provider's instructions about cleaning out your bowels. Surgery safety Ask your health care provider: How your surgery site will be marked. What steps will  be taken to help prevent infection. These steps may include: Removing hair at the surgery site. Washing skin with a germ-killing soap. Taking antibiotic medicine. General instructions Do not use any products that contain nicotine or tobacco for at least 4 weeks before the procedure. These products include cigarettes, chewing tobacco, and vaping devices, such as e-cigarettes. If you need help quitting, ask your health care provider. Plan to have a responsible adult take you home from the hospital or clinic. Plan to have a responsible adult care for you for the time you are told after you leave the hospital or clinic. You may have an exam or testing. This may include blood or urine samples, or imaging tests such as a CT scan or an MRI. What happens during the procedure? An IV will be put into a vein in your hand or arm. You may be given: A medicine to help you relax (sedative). A medicine to make you fall asleep (general anesthetic). A thin, flexible tube (Foley catheter) will be put into your penis through your urethra and into your bladder to drain your urine. Small incisions will be made in your abdomen and near your belly button. The laparoscope and other surgical instruments will be put through the incisions. The surgical tools will be used to cut and remove your prostate, seminal vesicles, and maybe your pelvic lymph nodes. Your surgeon will use a computer and robotic arms to control the surgical instruments. Your urethra will be cut and separated from your bladder to take out the prostate. Your urethra will then be reconnected to your bladder neck. This is the group of muscles that help push urine through your urethra. A small tube (drain) may be put in one or more of your incisions to help drain extra fluid from your surgical site after surgery. The laparoscope and other surgical instruments will be removed. Your incisions will be closed with stitches (sutures), skin glue, or adhesive  strips. Medicine may be applied and bandages (dressings) will be placed over your incisions. The procedure may vary among health care providers and hospitals. What happens after the procedure? Your blood pressure, heart rate, breathing rate, and blood oxygen level will be monitored until you leave the hospital or clinic. You may get fluids and medicines through your IV. You may be given antibiotics and medicines to help relieve pain or nausea. You will be encouraged to walk as soon as possible. You will also use a device or do breathing exercises to keep your lungs clear. The catheter will stay in to drain urine from your bladder. You will be taught how to care for it at home. The drain may stay in to drain fluid from the surgical site. If so, you will be taught how to care for it at home. You may need to wear compression stockings until you are able to get up and walk around. These stockings help prevent blood clots and reduce swelling in your legs. If you were given a sedative during the procedure, it can affect you for several hours. Do not drive or operate   machinery until your health care provider says that it is safe. Summary Robot-assisted laparoscopic radical prostatectomy is a surgical procedure to remove the entire prostate and the seminal vesicles. Follow instructions from your health care provider about eating and drinking before your surgery. After your procedure, you may be given fluids and medicines through an IV. You may get antibiotics and medicines to help relieve pain or nausea. After your surgery, you will continue to have a small, thin tube (Foley catheter) draining your urine. You will be taught how to care for it at home. This information is not intended to replace advice given to you by your health care provider. Make sure you discuss any questions you have with your health care provider. Document Revised: 11/05/2020 Document Reviewed: 11/05/2020 Elsevier Patient Education   2023 Elsevier Inc.  

## 2022-10-18 ENCOUNTER — Ambulatory Visit
Admission: RE | Admit: 2022-10-18 | Discharge: 2022-10-18 | Disposition: A | Discharge status: Home / Self Care | Payer: BC Managed Care – PPO | Source: Ambulatory Visit | Attending: Radiation Oncology | Admitting: Radiation Oncology

## 2022-10-18 ENCOUNTER — Encounter: Payer: Self-pay | Admitting: Radiation Oncology

## 2022-10-18 VITALS — BP 138/81 | HR 95 | Temp 97.5°F | Resp 20 | Ht 74.0 in | Wt 241.0 lb

## 2022-10-18 DIAGNOSIS — C61 Malignant neoplasm of prostate: Secondary | ICD-10-CM | POA: Diagnosis present

## 2022-10-18 DIAGNOSIS — I10 Essential (primary) hypertension: Secondary | ICD-10-CM | POA: Insufficient documentation

## 2022-10-18 DIAGNOSIS — Z79899 Other long term (current) drug therapy: Secondary | ICD-10-CM | POA: Diagnosis not present

## 2022-10-18 DIAGNOSIS — F17210 Nicotine dependence, cigarettes, uncomplicated: Secondary | ICD-10-CM | POA: Diagnosis not present

## 2022-10-18 DIAGNOSIS — E119 Type 2 diabetes mellitus without complications: Secondary | ICD-10-CM | POA: Diagnosis not present

## 2022-10-18 NOTE — Consult Note (Signed)
NEW PATIENT EVALUATION  Name: Kevin Holder  MRN: [id number]  Date:   10/18/2022     DOB: 1966/04/29   This 57 y.o. 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  REFERRING PHYSICIAN: Hortencia Pilar, MD  CHIEF COMPLAINT:  Chief Complaint  Patient presents with   Prostate Cancer    DIAGNOSIS: The encounter diagnosis was Prostate cancer ([place]).   PREVIOUS INVESTIGATIONS:  MRI scans reviewed Clinical notes reviewed Pathology report reviewed  HPI: Patient is a 57 year old male with a slowly rising PSA most recently 7.85.  He underwent a prostate MRI scan showing 1.8 cm focus of restricted diffusion involving the right anterior fibromuscular stroma and transitional zone.  There was possible mild extracapsular extension.  Highly suspicious for grade high adenocarcinoma.  He has very little urologic symptoms no specific urgency frequency or nocturia.  He underwent biopsy which was positive for 3 cores out of 12 positive for mostly Gleason 6 (3+3) in 2 cores and Gleason 7 (3+4 in 1 core.  He has seen Dr. Jeb Levering and discussion of options were commenced.  He is opted for I-125 interstitial implant.  PLANNED TREATMENT REGIMEN: I-125 interstitial implant  PAST MEDICAL HISTORY:  has a past medical history of Diabetes mellitus without complication ([place]) and Hypertension.    PAST SURGICAL HISTORY:  Past Surgical History:  Procedure Laterality Date   HEMORROIDECTOMY      FAMILY HISTORY: family history is not on file.  SOCIAL HISTORY:  reports that he has been smoking. He quit smokeless tobacco use about a year ago. He reports current alcohol use. He reports that he does not use drugs.  ALLERGIES: Lisinopril  MEDICATIONS:  Current Outpatient Medications  Medication Sig Dispense Refill   citalopram (CELEXA) 20 MG tablet Take 20 mg by mouth daily.     glipiZIDE (GLUCOTROL) 10  MG tablet Take 10 mg by mouth 2 (two) times daily before a meal.     glucose blood (PRECISION QID TEST) test strip 1 each.     ketoconazole (NIZORAL) 2 % shampoo Apply 1 Application topically 2 (two) times a week.     LORazepam (ATIVAN) 1 MG tablet Take 1 mg by mouth 2 (two) times daily as needed for anxiety.     losartan (COZAAR) 25 MG tablet Take 1 tablet by mouth daily.     metFORMIN (GLUCOPHAGE) 500 MG tablet 1 po daily x 1 weekly, 1 po bid ac     rosuvastatin (CRESTOR) 40 MG tablet Take 40 mg by mouth daily.     sildenafil (VIAGRA) 100 MG tablet Take 100 mg by mouth as needed for erectile dysfunction.     No current facility-administered medications for this encounter.    ECOG PERFORMANCE STATUS:  0 - Asymptomatic  REVIEW OF SYSTEMS: Patient does have adult onset diabetes and hypertension Patient denies any weight loss, fatigue, weakness, fever, chills or night sweats. Patient denies any loss of vision, blurred vision. Patient denies any ringing  of the ears or hearing loss. No irregular heartbeat. Patient denies heart murmur or history of fainting. Patient denies any chest pain or pain radiating to her upper extremities. Patient denies any shortness of breath, difficulty breathing at night, cough or hemoptysis. Patient denies any swelling in the lower legs. Patient denies any nausea vomiting, vomiting of blood, or coffee ground material in the vomitus. Patient denies any stomach pain. Patient states  has had normal bowel movements no significant constipation or diarrhea. Patient denies any dysuria, hematuria or significant nocturia. Patient denies any problems walking, swelling in the joints or loss of balance. Patient denies any skin changes, loss of hair or loss of weight. Patient denies any excessive worrying or anxiety or significant depression. Patient denies any problems with insomnia. Patient denies excessive thirst, polyuria, polydipsia. Patient denies any swollen glands, patient denies  easy bruising or easy bleeding. Patient denies any recent infections, allergies or URI. Patient "s visual fields have not changed significantly in recent time.   PHYSICAL EXAM: BP 138/81 (BP Location: Right Arm, Patient Position: Sitting, Cuff Size: Normal)   Pulse 95   Temp (!) 97.5 F (36.4 C) (Tympanic)   Resp 20   Ht '6\' 2"'$  (1.88 m)   Wt 241 lb (109.3 kg)   BMI 30.94 kg/m  Well-developed well-nourished patient in NAD. HEENT reveals PERLA, EOMI, discs not visualized.  Oral cavity is clear. No oral mucosal lesions are identified. Neck is clear without evidence of cervical or supraclavicular adenopathy. Lungs are clear to A&P. Cardiac examination is essentially unremarkable with regular rate and rhythm without murmur rub or thrill. Abdomen is benign with no organomegaly or masses noted. Motor sensory and DTR levels are equal and symmetric in the upper and lower extremities. Cranial nerves II through XII are grossly intact. Proprioception is intact. No peripheral adenopathy or edema is identified. No motor or sensory levels are noted. Crude visual fields are within normal range.  LABORATORY DATA: Pathology reports reviewed    RADIOLOGY RESULTS: MRI scan reviewed compatible with above-stated findings   IMPRESSION: Stage IIb Gleason 7 (3+4) adenocarcinoma the prostate presenting with a PSA in the 7 range in 57 year old male  PLAN: At this time patient is opted for I-125 interstitial implant.  Risks and benefits of treatment including increased lower Neri tract symptoms possible diarrhea fatigue and risks of general anesthesia all reviewed with the patient and his daughter who is in healthcare.  I have also gone over radiation safety precautions once he is implanted.  I explained to him the volume study and our protocol for going ahead with the implant.  They both seem to comprehend my treatment plan well.  I have asked him to return to Dr. Tilman Neat office for a 49-monthdepot of Eligard to be used  in conjunction with his implant.  I would like to take this opportunity to thank you for allowing me to participate in the care of your patient..Noreene Filbert MD

## 2022-10-20 ENCOUNTER — Other Ambulatory Visit: Payer: Self-pay

## 2022-10-20 DIAGNOSIS — C61 Malignant neoplasm of prostate: Secondary | ICD-10-CM

## 2022-10-20 NOTE — Progress Notes (Signed)
Brachytherapy Standing Order Form:  Part 1 Volume Study:  Date: 11/10/2022  CPT: [id number], [id number] Procedure: Prostate Volume Study  Part 2 Seed Implant:   Surgeon: Nickolas Madrid, MD  Date: 12/10/2022    CPT code: [id number], [id number], [id number] Procedure: Brachytherapy Radioactive seed implant  Anesthesia: General  VTE: SCD's  Standing Orders:  -Ancef 2G IV,  -Fleets 2 hr prior, -NPO after midnight,  -UA & Culture

## 2022-10-26 ENCOUNTER — Telehealth: Payer: Self-pay

## 2022-10-26 NOTE — Telephone Encounter (Signed)
Called pt's insurance to verify PA needed for [id number], PA is required however they are unable to complete PA via phone as address we have on file does not match what they have, rep suggested completing PA via [employer] which I do not have access too. Rep. Brayton Layman will fax form for PA completion. Will complete form when received.

## 2022-10-26 NOTE — Telephone Encounter (Signed)
-----   Message from [place], [place] sent at 10/19/2022  2:38 PM EST ----- Regarding: RE: Eligard Pt has been scheduled for 3/25 at 230 in [place]. Thanks.   CM ----- Message ----- From: Christean Grief, RN Sent: 10/18/2022   2:25 PM EST To: Manus Rudd, RN; Joyice Faster, CMA; # Subject: [place] [place] Afternoon,  This patient will need to receive Eligard injections.   Thanks, Ranelle Oyster

## 2022-11-08 NOTE — Telephone Encounter (Signed)
Eligard PA approved via [employer]  Auth# [id number]  Dates: 11/15/22 - 12/06/23

## 2022-11-10 ENCOUNTER — Ambulatory Visit
Admission: RE | Admit: 2022-11-10 | Discharge: 2022-11-10 | Discharge status: Home / Self Care | Payer: BC Managed Care – PPO | Source: Ambulatory Visit | Attending: Radiation Oncology | Admitting: Radiation Oncology

## 2022-11-10 ENCOUNTER — Ambulatory Visit
Admission: RE | Admit: 2022-11-10 | Discharge: 2022-11-10 | Disposition: A | Discharge status: Home / Self Care | Payer: BC Managed Care – PPO | Source: Ambulatory Visit | Attending: Radiation Oncology | Admitting: Radiation Oncology

## 2022-11-10 ENCOUNTER — Ambulatory Visit: Admission: RE | Admit: 2022-11-10 | Payer: BC Managed Care – PPO | Source: Home / Self Care

## 2022-11-10 VITALS — BP 147/97 | HR 90 | Temp 97.3°F | Resp 16 | Wt 234.0 lb

## 2022-11-10 DIAGNOSIS — C61 Malignant neoplasm of prostate: Secondary | ICD-10-CM

## 2022-11-10 SURGERY — ULTRASOUND, PROSTATE, FOR VOLUME DETERMINATION
Anesthesia: Choice

## 2022-11-10 NOTE — Progress Notes (Signed)
Radiation Oncology Volume study note  Name: Kevin Holder   Date:   11/10/2022 MRN:  [id number] DOB: 1966-07-22    This 57 y.o. 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 He underwent a prostate MRI scan showing 1.8 cm focus of restricted diffusion involving the right anterior fibromuscular stroma and transitional zone. There was possible mild extracapsular extension. Highly suspicious for grade high adenocarcinoma. He has very little urologic symptoms no specific urgency frequency or nocturia. He underwent biopsy which was positive for 3 cores out of 12 positive for mostly Gleason 6 (3+3) in 2 cores and Gleason 7 (3+4 in 1 core. He has seen Dr. Jeb Levering and discussion of options were commenced. He is opted for I-125 interstitial implant. Marland Kitchen  He was taken to the OR today for volume study.  COMPLICATIONS OF TREATMENT: none  FOLLOW UP COMPLIANCE: keeps appointments   PHYSICAL EXAM:  BP (!) 147/97 Comment: Recheck patient has not taken meds today  Pulse 90   Temp (!) 97.3 F (36.3 C) (Tympanic)   Resp 16   Wt 234 lb (106.1 kg)   BMI 30.04 kg/m  Well-developed well-nourished patient in NAD. HEENT reveals PERLA, EOMI, discs not visualized.  Oral cavity is clear. No oral mucosal lesions are identified. Neck is clear without evidence of cervical or supraclavicular adenopathy. Lungs are clear to A&P. Cardiac examination is essentially unremarkable with regular rate and rhythm without murmur rub or thrill. Abdomen is benign with no organomegaly or masses noted. Motor sensory and DTR levels are equal and symmetric in the upper and lower extremities. Cranial nerves II through XII are grossly intact. Proprioception is intact. No peripheral adenopathy or  edema is identified. No motor or sensory levels are noted. Crude visual fields are within normal range.  RADIOLOGY RESULTS: Ultrasound used for volume study  PLAN: Patient was taken to the cystoscopy suite in the OR. Patient was placed in the low lithotomy position. Foley catheter was placed. Trans-rectal ultrasound probe was inserted into the rectum and prostate seminal vesicles were visualized as well as bladder base. stepping images were performed on a 5 mm increments. Images will be placed in BrachyVision treatment planning system to determine seed placement coordinates for eventual I-125 interstitial implant. Images will be reviewed with the physics and dosimetry staff for final quality approval. I personally was present for the volume study and assisted in delineation of contour volumes.  At the end of the procedure Foley catheter was removed, rectal ultrasound probe was removed. Patient tolerated his procedures extremely well with no side effects or complaints. Patient has given appointment for interstitial implant date. Consent was signed today as well as history and physical performed in preparation for his outpatient surgical implant.     Noreene Filbert, MD

## 2022-11-10 NOTE — Progress Notes (Signed)
Radiation Oncology Follow up Note  Name: Kevin Holder   Date:   11/10/2022 MRN:  [id number] DOB: 15-May-1966    This 57 y.o. male presents to the clinic today for follow-up and preparation of volume study leading to I-125 interstitial implant for stage IIb (cT1c cN0 M0) Gleason 7 (3+4) adenocarcinoma prostate presenting with a PSA of 7.8.  REFERRING PROVIDER: Hortencia Pilar, MD  HPI: Patient is a 57 year old male seen today for evaluation prior to provide study anticipation of an I-125 interstitial implant for stage IIb Gleason 7 adenocarcinoma of the prostate.  Seen today he is doing well he is asymptomatic.  He was taken to the OR for volume study..  COMPLICATIONS OF TREATMENT: none  FOLLOW UP COMPLIANCE: keeps appointments   PHYSICAL EXAM:  There were no vitals taken for this visit. Well-developed well-nourished patient in NAD. HEENT reveals PERLA, EOMI, discs not visualized.  Oral cavity is clear. No oral mucosal lesions are identified. Neck is clear without evidence of cervical or supraclavicular adenopathy. Lungs are clear to A&P. Cardiac examination is essentially unremarkable with regular rate and rhythm without murmur rub or thrill. Abdomen is benign with no organomegaly or masses noted. Motor sensory and DTR levels are equal and symmetric in the upper and lower extremities. Cranial nerves II through XII are grossly intact. Proprioception is intact. No peripheral adenopathy or edema is identified. No motor or sensory levels are noted. Crude visual fields are within normal range.  RADIOLOGY RESULTS: Ultrasound used for volume study  PLAN: At this time I gone over risks and benefits of the I-125 interstitial implant including risks of general anesthesia as well as radiation safety precautions following.  Will have possibly increased lower urinary tract symptoms possible diarrhea fatigue.  I explained he will be radioactive for 2 months weight from small babies or pregnant women.   Patient comprehends my recommendations well.  I am study was performed.  I would like to take this opportunity to thank you for allowing me to participate in the care of your patient.Noreene Filbert, MD

## 2022-11-12 NOTE — Progress Notes (Signed)
11/15/2022 5:20 PM   Kevin Holder Jul 03, 1966 [id number]  Referring provider: Hortencia Pilar, [place],  [place] [address]  Urological history: 1. Prostate cancer -iPSA 7.85 -prostate MRI, which showed 43 g prostate with PI-RADS 5 lesion in the right anterior stroma and transition zone in the mid gland and apex, no evidence of metastatic disease -fusion biopsy in [place] which showed negative cores in the ROI, however single core on the right side of Gleason score 3+4=7 disease with 20% max core involvement, and 2 additional cores of 3+3=6 -scheduled for I-125 interstitial implant (12/10/2022) -recommend six month ADT  No chief complaint on file.   HPI: Kevin Holder is a 57 y.o. male who presents today for ADT.  The procedure is explained, patient had time for questions and he is ready to proceed.   PMH: Past Medical History:  Diagnosis Date   Diabetes mellitus without complication ([place])    Hypertension     Surgical History: Past Surgical History:  Procedure Laterality Date   HEMORROIDECTOMY      Home Medications:  Allergies as of 11/15/2022       Reactions   Lisinopril Cough        Medication List        Accurate as of November 12, 2022  5:20 PM. If you have any questions, ask your nurse or doctor.          citalopram 20 MG tablet Commonly known as: CELEXA Take 20 mg by mouth daily.   glipiZIDE 10 MG tablet Commonly known as: GLUCOTROL Take 10 mg by mouth 2 (two) times daily before a meal.   ketoconazole 2 % shampoo Commonly known as: NIZORAL Apply 1 Application topically 2 (two) times a week.   LORazepam 1 MG tablet Commonly known as: ATIVAN Take 1 mg by mouth 2 (two) times daily as needed for anxiety.   losartan 25 MG tablet Commonly known as: COZAAR Take 1 tablet by mouth daily.   metFORMIN 500 MG tablet Commonly known as: GLUCOPHAGE 1 po daily x 1 weekly, 1 po bid ac   Precision QID Test test strip Generic  drug: glucose blood 1 each.   rosuvastatin 40 MG tablet Commonly known as: CRESTOR Take 40 mg by mouth daily.   sildenafil 100 MG tablet Commonly known as: VIAGRA Take 100 mg by mouth as needed for erectile dysfunction.        Allergies:  Allergies  Allergen Reactions   Lisinopril Cough    Family History: No family history on file.  Social History:  reports that he has been smoking. He quit smokeless tobacco use about 13 months ago. He reports current alcohol use. He reports that he does not use drugs.  ROS: Pertinent ROS in HPI  Physical Exam: There were no vitals taken for this visit.  Constitutional:  Well nourished. Alert and oriented, No acute distress. HEENT: [place] AT, moist mucus membranes.  Trachea midline, no masses. Cardiovascular: No clubbing, cyanosis, or edema. Respiratory: Normal respiratory effort, no increased work of breathing. Neurologic: Grossly intact, no focal deficits, moving all 4 extremities. Psychiatric: Normal mood and affect.  Laboratory Data: Serum creatinine (10/2022) 1.0 Hemoglobin A1c (10/2022) 6.7 I have reviewed the labs.   Pertinent Imaging: Narrative & Impression  CLINICAL DATA:  Elevated PSA.   EXAM: MR PROSTATE WITHOUT AND WITH CONTRAST   TECHNIQUE: Multiplanar multisequence MRI images were obtained of the pelvis centered about the prostate. Pre and post contrast images  were obtained.   CONTRAST:  57mL GADAVIST GADOBUTROL 1 MMOL/ML IV SOLN   COMPARISON:  None Available.   FINDINGS: Prostate:   -- A focal area of restricted diffusion is seen involving the right anterior fibromuscular stroma and transition zone in the mid gland and apex. This measures 1.8 x 0.9 cm on image 14/8, and shows marked ADC hypointensity and DWI hyperintensity, as well as early focal contrast enhancement. (PI-RADS 5)   -- Measurements/Volume:  4.7 x 3.9 x 4.5 cm (volume = 43 cm^3)   Transcapsular spread: Focal capsular bulge in the right  anterior apex seen on image 17/6, is suspicious for mild extracapsular extension.   Seminal vesicle involvement:  Absent   Neurovascular bundle involvement:  Absent   Pelvic adenopathy: None visualized   Bone metastasis: None visualized   Other:  Sigmoid diverticulosis, without evidence of diverticulitis.   IMPRESSION: 1.8 cm focus of restricted diffusion involving the right anterior fibromuscular stroma and transition zone in the mid gland and apex, with possible mild extracapsular extension. This highly suspicious for high-grade carcinoma. PI-RADS 5 (v2.1): Very high (clinically significant cancer highly likely).   No evidence of pelvic metastatic disease.   (I have post-processed this exam in the DynaCAD application for potential fusion-guided biopsy.)     Electronically Signed   By: Marlaine Hind M.D.   On: 08/31/2022 19:21       Assessment & Plan:  ***  1. Prostate cancer -we injection site reactions could include transient burning and stinging, pain, bruising and redness -we discussed with patient that common side effects include hot flashes, sweats, fatigue, weakness, muscle pain, dizziness, clamminess, testicular shrinkage, decreased erections and enlargement of breasts -we discussed the side effect of thinning of the bones that may lead to a fracture and it is important that he start calcium supplementation with vitamin D -we discussed the increased risks of heart attack, irregular heartbeat, sudden death due to heart problems and stroke -we discussed that elevated blood sugar and increased risk developing diabetes may occur   No follow-ups on file.  These notes generated with voice recognition software. I apologize for typographical errors.  Jenkins, [place] [address]  [place],  [address] [phone]

## 2022-11-15 ENCOUNTER — Encounter: Payer: Self-pay | Admitting: Urology

## 2022-11-15 ENCOUNTER — Ambulatory Visit: Payer: BC Managed Care – PPO | Admitting: Urology

## 2022-11-15 VITALS — BP 138/82 | HR 108 | Ht 74.0 in | Wt 235.0 lb

## 2022-11-15 DIAGNOSIS — C61 Malignant neoplasm of prostate: Secondary | ICD-10-CM

## 2022-11-15 MED ORDER — LEUPROLIDE ACETATE (6 MONTH) 45 MG ~~LOC~~ KIT
45.0000 mg | PACK | Freq: Once | SUBCUTANEOUS | Status: AC
  Administered 2022-11-15: 45 mg via SUBCUTANEOUS

## 2022-11-15 NOTE — Progress Notes (Signed)
Eligard SubQ Injection   Due to Prostate Cancer patient is present today for a Eligard Injection.  Medication: Eligard 6 month Dose: 45 mg  Location: right  Lot: [id number] Exp: 09/2023  Patient tolerated well, no complications were noted  Performed by: Elberta Leatherwood, [place]  Per Dr. Baruch Gouty patient is to continue therapy for 6 months . Patient's next follow up was scheduled for 12/10/2022. This appointment was scheduled using wheel and given to patient today along with reminder continue on Vitamin D 800-1000iu and Calcium 1000-1200mg  daily while on Androgen Deprivation Therapy.  PA approval dates:

## 2022-11-15 NOTE — Patient Instructions (Signed)
continue on Vitamin D 800-1000iu and Calcium 1000-1200mg daily while on Androgen Deprivation Therapy.  

## 2022-11-16 ENCOUNTER — Telehealth: Payer: Self-pay

## 2022-11-16 NOTE — Telephone Encounter (Signed)
Ashleigh, patient's daughter, dropped off FMLA forms for her job to be able to take care of her father for appointments/treatments. Spoke with Gerald Leitz, upcoming surgery is going to be done with Dr Baruch Gouty and we are assisting with it. Forms need to go to Dr Olena Leatherwood office. I took forms there and made a blank copy for [place] to keep just in case. I called and left detailed message for Ashleigh letting her know this and that their office will be in touch.

## 2022-12-02 ENCOUNTER — Inpatient Hospital Stay: Admission: RE | Admit: 2022-12-02 | Payer: BC Managed Care – PPO | Source: Ambulatory Visit

## 2022-12-02 ENCOUNTER — Encounter
Admission: RE | Admit: 2022-12-02 | Discharge: 2022-12-02 | Disposition: A | Discharge status: Home / Self Care | Payer: BC Managed Care – PPO | Source: Ambulatory Visit | Attending: Urology | Admitting: Urology

## 2022-12-02 VITALS — Ht 74.0 in | Wt 235.9 lb

## 2022-12-02 DIAGNOSIS — Z01810 Encounter for preprocedural cardiovascular examination: Secondary | ICD-10-CM

## 2022-12-02 DIAGNOSIS — E119 Type 2 diabetes mellitus without complications: Secondary | ICD-10-CM

## 2022-12-02 DIAGNOSIS — Z01818 Encounter for other preprocedural examination: Secondary | ICD-10-CM

## 2022-12-02 DIAGNOSIS — I10 Essential (primary) hypertension: Secondary | ICD-10-CM

## 2022-12-02 HISTORY — DX: Male erectile dysfunction, unspecified: N52.9

## 2022-12-02 HISTORY — DX: Gastro-esophageal reflux disease without esophagitis: K21.9

## 2022-12-02 HISTORY — DX: Malignant neoplasm of prostate: C61

## 2022-12-02 HISTORY — DX: Type 2 diabetes mellitus without complications: E11.9

## 2022-12-02 HISTORY — DX: Anemia, unspecified: D64.9

## 2022-12-02 HISTORY — DX: Hyperlipidemia, unspecified: E78.5

## 2022-12-02 NOTE — Patient Instructions (Addendum)
Your procedure is scheduled on:12-10-22 Friday Report to the Registration Desk on the 1st floor of the [facility].Then proceed to the 2nd floor Surgery Desk To find out your arrival time, please call [phone] between 1PM - 3PM on:12-09-22 Thursday If your arrival time is 6:00 am, do not arrive before that time as the [facility] entrance doors do not open until 6:00 am.  REMEMBER: Instructions that are not followed completely may result in serious medical risk, up to and including death; or upon the discretion of your surgeon and anesthesiologist your surgery may need to be rescheduled.  Do not eat food OR drink any liquids after midnight the night before surgery.  No gum chewing or hard candies.  One week prior to surgery:Last dose 12-02-22 Stop Anti-inflammatories (NSAIDS) such as Advil, Aleve, Ibuprofen, Motrin, Naproxen, Naprosyn and Aspirin based products such as Excedrin, Goody's Powder, BC Powder.You may however, take Tylenol if needed for pain up until the day of surgery.  Stop ANY OVER THE COUNTER supplements/vitamins NOW (12-02-22) until after surgery (Calcium-Vitamin D)  Stop your metFORMIN (GLUCOPHAGE) 2 days prior to surgery-Last dose will be on 12-07-22 Tuesday  Stop your sildenafil (VIAGRA) 2 days prior to surgery-Last dose will be on 12-07-22 Tuesday  TAKE ONLY THESE MEDICATIONS THE MORNING OF SURGERY WITH A SIP OF WATER: -citalopram (CELEXA)  -rosuvastatin (CRESTOR)  -You may take LORazepam (ATIVAN) if needed for anxiety  Continue your losartan (COZAAR) up until the day prior to surgery-Do NOT take the morning of surgery  Do Fleet Enema the morning of your surgery 1 hour prior to your arrival time to the hospital   No Alcohol for 24 hours before or after surgery.  No Smoking including e-cigarettes for 24 hours before surgery.  No chewable tobacco products for at least 6 hours before surgery.  No nicotine patches on the day of surgery.  Do not use any  "recreational" drugs for at least a week (preferably 2 weeks) before your surgery.  Please be advised that the combination of cocaine and anesthesia may have negative outcomes, up to and including death. If you test positive for cocaine, your surgery will be cancelled.  On the morning of surgery brush your teeth with toothpaste and water, you may rinse your mouth with mouthwash if you wish. Do not swallow any toothpaste or mouthwash.  Do not wear jewelry, make-up, hairpins, clips or nail polish.  Do not wear lotions, powders, or perfumes.   Do not shave body hair from the neck down 48 hours before surgery.  Contact lenses, hearing aids and dentures may not be worn into surgery.  Do not bring valuables to the hospital. [facility] is not responsible for any missing/lost belongings or valuables.   Notify your doctor if there is any change in your medical condition (cold, fever, infection).  Wear comfortable clothing (specific to your surgery type) to the hospital.  After surgery, you can help prevent lung complications by doing breathing exercises.  Take deep breaths and cough every 1-2 hours. Your doctor may order a device called an Incentive Spirometer to help you take deep breaths. When coughing or sneezing, hold a pillow firmly against your incision with both hands. This is called "splinting." Doing this helps protect your incision. It also decreases belly discomfort.  If you are being admitted to the hospital overnight, leave your suitcase in the car. After surgery it may be brought to your room.  In case of increased patient census, it may be necessary  for you, the patient, to continue your postoperative care in the Same Day Surgery department.  If you are being discharged the day of surgery, you will not be allowed to drive home. You will need a responsible individual to drive you home and stay with you for 24 hours after surgery.   If you are taking public transportation, you  will need to have a responsible individual with you.  Please call the Pre-admissions Testing Dept. at [phone] if you have any questions about these instructions.  Surgery Visitation Policy:  Patients having surgery or a procedure may have two visitors.  Children under the age of 58 must have an adult with them who is not the patient.  Sodium Phosphate Monobasic; Sodium Phosphate Dibasic Enema What is this medication? SODIUM PHOSPATE SALTS (SOE dee um FOS fate sawlts) treats occasional constipation. It may also be used to clean the bowel out before a medical procedure. It works by increasing the amount of water your intestine absorbs. This softens the stool, making it easier to have a bowel movement. It also increases pressure, which prompts the muscles in your intestines to move stool. It belongs to a group of medications called laxatives. This medicine may be used for other purposes; ask your health care provider or pharmacist if you have questions. COMMON BRAND NAME(S): Fleet, Fleet Pedia-Lax, Ready To Use Saline  How should I use this medication? This medication is for rectal use only. Do not take it by mouth. Take it as directed on the prescription label. Do not use more often than directed. Wash your hands before and after use. Remove top of enema. Lubricate the tip of the bottle. Lie on your side with your lower leg straightened out and your upper leg bent forward toward your stomach. Lift upper buttock to expose the rectal area. Gently insert the tip into the rectum. Squeeze bottle until it is empty. Wait a few seconds before removing the bottle. Hold buttocks together for a few seconds. Remain lying down for about 15 minutes to avoid having the medication come out.

## 2022-12-06 ENCOUNTER — Encounter
Admission: RE | Admit: 2022-12-06 | Discharge: 2022-12-06 | Disposition: A | Discharge status: Home / Self Care | Payer: BC Managed Care – PPO | Source: Ambulatory Visit | Attending: Urology | Admitting: Urology

## 2022-12-06 DIAGNOSIS — Z01810 Encounter for preprocedural cardiovascular examination: Secondary | ICD-10-CM | POA: Diagnosis not present

## 2022-12-06 DIAGNOSIS — E119 Type 2 diabetes mellitus without complications: Secondary | ICD-10-CM | POA: Insufficient documentation

## 2022-12-06 DIAGNOSIS — I10 Essential (primary) hypertension: Secondary | ICD-10-CM | POA: Diagnosis not present

## 2022-12-06 DIAGNOSIS — Z01812 Encounter for preprocedural laboratory examination: Secondary | ICD-10-CM | POA: Diagnosis present

## 2022-12-08 DIAGNOSIS — C61 Malignant neoplasm of prostate: Secondary | ICD-10-CM | POA: Diagnosis present

## 2022-12-08 NOTE — H&P (Signed)
NEW PATIENT EVALUATION  Name: Kevin Holder  MRN: [id number]  Date:   11/10/2022     DOB: March 16, 1966   This 57 y.o. male patient presents to the clinic for volume study in anticipation of I-125 interstitial implant in a patient with Gleason 7 adenocarcinoma the prostate presenting with a PSA of 7.8 REFERRING PHYSICIAN: Rolm Gala, MD  CHIEF COMPLAINT: No chief complaint on file.   DIAGNOSIS: The encounter diagnosis was Prostate cancer (HCC).   PREVIOUS INVESTIGATIONS:  Pathology report reviewed MRI of prostate reviewed Clinical notes reviewed  HPI: Patient is a 58 year old male with a slowly rising PSA most recently 7.85. He underwent a prostate MRI scan showing 1.8 cm focus of restricted diffusion involving the right anterior fibromuscular stroma and transitional zone. There was possible mild extracapsular extension. Highly suspicious for grade high adenocarcinoma. He has very little urologic symptoms no specific urgency frequency or nocturia. He underwent biopsy which was positive for 3 cores out of 12 positive for mostly Gleason 6 (3+3) in 2 cores and Gleason 7 (3+4 in 1 core.  Patient is opted for I-125 interstitial implant.  PLANNED TREATMENT REGIMEN: I-125 interstitial implant  PAST MEDICAL HISTORY:  has a past medical history of Anemia, DM (diabetes mellitus), type 2, ED (erectile dysfunction), GERD (gastroesophageal reflux disease), Hyperlipidemia, Hypertension, and Prostate cancer.    PAST SURGICAL HISTORY:  Past Surgical History:  Procedure Laterality Date   HEMORROIDECTOMY     MOUTH SURGERY      FAMILY HISTORY: family history is not on file.  SOCIAL HISTORY:  reports that he quit smoking about 2 years ago. His smoking use included cigarettes. He smoked an average of .5 packs per day. He quit smokeless tobacco use about 13 months ago. He reports current alcohol use. He reports that he does not use drugs.  ALLERGIES: Lisinopril  MEDICATIONS:  Current Outpatient  Medications  Medication Sig Dispense Refill   citalopram (CELEXA) 20 MG tablet Take 20 mg by mouth daily before lunch.     glipiZIDE (GLUCOTROL) 10 MG tablet Take 10 mg by mouth 2 (two) times daily before a meal.     glucose blood (PRECISION QID TEST) test strip 1 each.     ketoconazole (NIZORAL) 2 % shampoo Apply 1 Application topically 2 (two) times a week. (Patient not taking: Reported on 12/02/2022)     LORazepam (ATIVAN) 1 MG tablet Take 1 mg by mouth 2 (two) times daily as needed for anxiety.     losartan (COZAAR) 25 MG tablet Take 1 tablet by mouth daily before lunch.     metFORMIN (GLUCOPHAGE) 500 MG tablet Take 1,000 mg by mouth 2 (two) times daily with a meal.     rosuvastatin (CRESTOR) 40 MG tablet Take 40 mg by mouth daily before lunch.     sildenafil (VIAGRA) 100 MG tablet Take 100 mg by mouth as needed for erectile dysfunction.     calcium carbonate (TUMS - DOSED IN MG ELEMENTAL CALCIUM) 500 MG chewable tablet Chew 1 tablet by mouth daily as needed for indigestion or heartburn.     CALCIUM-VITAMIN D PO Take 1 tablet by mouth 2 (two) times daily.     diphenhydrAMINE (BENADRYL) 25 mg capsule Take 50 mg by mouth every 6 (six) hours as needed for sleep.     No current facility-administered medications for this encounter.    ECOG PERFORMANCE STATUS:  0 - Asymptomatic  REVIEW OF SYSTEMS: Patient denies any weight loss, fatigue, weakness, fever, chills or night  sweats. Patient denies any loss of vision, blurred vision. Patient denies any ringing  of the ears or hearing loss. No irregular heartbeat. Patient denies heart murmur or history of fainting. Patient denies any chest pain or pain radiating to her upper extremities. Patient denies any shortness of breath, difficulty breathing at night, cough or hemoptysis. Patient denies any swelling in the lower legs. Patient denies any nausea vomiting, vomiting of blood, or coffee ground material in the vomitus. Patient denies any stomach pain.  Patient states has had normal bowel movements no significant constipation or diarrhea. Patient denies any dysuria, hematuria or significant nocturia. Patient denies any problems walking, swelling in the joints or loss of balance. Patient denies any skin changes, loss of hair or loss of weight. Patient denies any excessive worrying or anxiety or significant depression. Patient denies any problems with insomnia. Patient denies excessive thirst, polyuria, polydipsia. Patient denies any swollen glands, patient denies easy bruising or easy bleeding. Patient denies any recent infections, allergies or URI. Patient "s visual fields have not changed significantly in recent time.   PHYSICAL EXAM: BP (!) 147/97 Comment: Recheck patient has not taken meds today  Pulse 90   Temp (!) 97.3 F (36.3 C) (Tympanic)   Resp 16   Wt 234 lb (106.1 kg)   BMI 30.04 kg/m  Well-developed well-nourished patient in NAD. HEENT reveals PERLA, EOMI, discs not visualized.  Oral cavity is clear. No oral mucosal lesions are identified. Neck is clear without evidence of cervical or supraclavicular adenopathy. Lungs are clear to A&P. Cardiac examination is essentially unremarkable with regular rate and rhythm without murmur rub or thrill. Abdomen is benign with no organomegaly or masses noted. Motor sensory and DTR levels are equal and symmetric in the upper and lower extremities. Cranial nerves II through XII are grossly intact. Proprioception is intact. No peripheral adenopathy or edema is identified. No motor or sensory levels are noted. Crude visual fields are within normal range.  LABORATORY DATA: Labs reviewed    RADIOLOGY RESULTS: MRI scan reviewed   IMPRESSION: Clinical stage IIb (cT1 cN0 M0) Gleason 7 (3+4) adenocarcinoma the prostate presenting with a PSA of 7.8  PLAN: At this time patient is cleared for proceeding with I-125 interstitial implant.  Risks and benefits of treatment including risks of general anesthesia  increased lower urinary tract symptoms possible diarrhea fatigue and review of radiation safety precautions oral performed and discussed with the patient.  He comprehends her recommendations well.  Carmina Miller, MD

## 2022-12-09 ENCOUNTER — Telehealth: Payer: Self-pay | Admitting: *Deleted

## 2022-12-09 NOTE — Telephone Encounter (Signed)
FMLA for patient daughter received and completed, sent for doctor signature

## 2022-12-10 ENCOUNTER — Encounter
Admission: RE | Disposition: A | Discharge status: Home / Self Care | Payer: Self-pay | Source: Home / Self Care | Attending: Urology

## 2022-12-10 ENCOUNTER — Ambulatory Visit: Payer: BC Managed Care – PPO | Attending: Radiation Oncology

## 2022-12-10 ENCOUNTER — Ambulatory Visit: Payer: BC Managed Care – PPO | Admitting: Anesthesiology

## 2022-12-10 ENCOUNTER — Encounter: Payer: Self-pay | Admitting: Urology

## 2022-12-10 ENCOUNTER — Ambulatory Visit
Admission: RE | Admit: 2022-12-10 | Discharge: 2022-12-10 | Disposition: A | Discharge status: Home / Self Care | Payer: BC Managed Care – PPO | Attending: Urology | Admitting: Urology

## 2022-12-10 ENCOUNTER — Ambulatory Visit: Payer: BC Managed Care – PPO

## 2022-12-10 DIAGNOSIS — N529 Male erectile dysfunction, unspecified: Secondary | ICD-10-CM | POA: Diagnosis not present

## 2022-12-10 DIAGNOSIS — Z87891 Personal history of nicotine dependence: Secondary | ICD-10-CM | POA: Diagnosis not present

## 2022-12-10 DIAGNOSIS — I10 Essential (primary) hypertension: Secondary | ICD-10-CM | POA: Diagnosis not present

## 2022-12-10 DIAGNOSIS — E119 Type 2 diabetes mellitus without complications: Secondary | ICD-10-CM | POA: Diagnosis not present

## 2022-12-10 DIAGNOSIS — Z01818 Encounter for other preprocedural examination: Secondary | ICD-10-CM

## 2022-12-10 DIAGNOSIS — C61 Malignant neoplasm of prostate: Secondary | ICD-10-CM

## 2022-12-10 HISTORY — PX: RADIOACTIVE SEED IMPLANT: SHX5150

## 2022-12-10 LAB — GLUCOSE, CAPILLARY
Glucose-Capillary: 162 mg/dL — ABNORMAL HIGH (ref 70–99)
Glucose-Capillary: 181 mg/dL — ABNORMAL HIGH (ref 70–99)

## 2022-12-10 SURGERY — INSERTION, RADIATION SOURCE, PROSTATE
Anesthesia: General | Site: Prostate

## 2022-12-10 MED ORDER — HYDROCODONE-ACETAMINOPHEN 5-325 MG PO TABS: 1.0000 | ORAL_TABLET | Freq: Four times a day (QID) | ORAL | 0 refills | Status: AC | PRN

## 2022-12-10 MED ORDER — BACITRACIN ZINC 500 UNIT/GM EX OINT
TOPICAL_OINTMENT | CUTANEOUS | Status: AC
  Filled 2022-12-10: qty 28.35

## 2022-12-10 MED ORDER — SODIUM CHLORIDE 0.9 % IV SOLN: INTRAVENOUS | Status: DC

## 2022-12-10 MED ORDER — PROPOFOL 10 MG/ML IV BOLUS
INTRAVENOUS | Status: AC
  Filled 2022-12-10: qty 20

## 2022-12-10 MED ORDER — MIDAZOLAM HCL 2 MG/2ML IJ SOLN
INTRAMUSCULAR | Status: DC | PRN
  Administered 2022-12-10: 2 mg via INTRAVENOUS

## 2022-12-10 MED ORDER — FENTANYL CITRATE (PF) 100 MCG/2ML IJ SOLN
INTRAMUSCULAR | Status: DC | PRN
  Administered 2022-12-10: 25 ug via INTRAVENOUS
  Administered 2022-12-10: 50 ug via INTRAVENOUS
  Administered 2022-12-10: 25 ug via INTRAVENOUS

## 2022-12-10 MED ORDER — FAMOTIDINE 20 MG PO TABS
ORAL_TABLET | ORAL | Status: AC
  Filled 2022-12-10: qty 1

## 2022-12-10 MED ORDER — SUCCINYLCHOLINE CHLORIDE 200 MG/10ML IV SOSY
PREFILLED_SYRINGE | INTRAVENOUS | Status: AC
  Filled 2022-12-10: qty 10

## 2022-12-10 MED ORDER — CIPROFLOXACIN HCL 500 MG PO TABS: 500.0000 mg | ORAL_TABLET | Freq: Two times a day (BID) | ORAL | 0 refills | Status: AC

## 2022-12-10 MED ORDER — DEXAMETHASONE SODIUM PHOSPHATE 10 MG/ML IJ SOLN
INTRAMUSCULAR | Status: AC
  Filled 2022-12-10: qty 1

## 2022-12-10 MED ORDER — TAMSULOSIN HCL 0.4 MG PO CAPS: 0.4000 mg | ORAL_CAPSULE | Freq: Every day | ORAL | 0 refills | Status: DC

## 2022-12-10 MED ORDER — PROPOFOL 10 MG/ML IV BOLUS
INTRAVENOUS | Status: DC | PRN
  Administered 2022-12-10: 200 mg via INTRAVENOUS

## 2022-12-10 MED ORDER — FAMOTIDINE 20 MG PO TABS
20.0000 mg | ORAL_TABLET | Freq: Once | ORAL | Status: AC
  Administered 2022-12-10: 20 mg via ORAL

## 2022-12-10 MED ORDER — KETAMINE HCL 50 MG/5ML IJ SOSY
PREFILLED_SYRINGE | INTRAMUSCULAR | Status: AC
  Filled 2022-12-10: qty 5

## 2022-12-10 MED ORDER — MIDAZOLAM HCL 2 MG/2ML IJ SOLN
INTRAMUSCULAR | Status: AC
  Filled 2022-12-10: qty 2

## 2022-12-10 MED ORDER — CEFAZOLIN SODIUM-DEXTROSE 2-4 GM/100ML-% IV SOLN
INTRAVENOUS | Status: AC
  Filled 2022-12-10: qty 100

## 2022-12-10 MED ORDER — FENTANYL CITRATE (PF) 100 MCG/2ML IJ SOLN
INTRAMUSCULAR | Status: AC
  Filled 2022-12-10: qty 2

## 2022-12-10 MED ORDER — ROCURONIUM BROMIDE 10 MG/ML (PF) SYRINGE
PREFILLED_SYRINGE | INTRAVENOUS | Status: AC
  Filled 2022-12-10: qty 10

## 2022-12-10 MED ORDER — ONDANSETRON HCL 4 MG/2ML IJ SOLN
INTRAMUSCULAR | Status: AC
  Filled 2022-12-10: qty 2

## 2022-12-10 MED ORDER — FLEET ENEMA 7-19 GM/118ML RE ENEM
1.0000 | ENEMA | Freq: Once | RECTAL | Status: AC
  Administered 2022-12-10: 1 via RECTAL

## 2022-12-10 MED ORDER — KETAMINE HCL 10 MG/ML IJ SOLN
INTRAMUSCULAR | Status: DC | PRN
  Administered 2022-12-10: 50 mg via INTRAVENOUS

## 2022-12-10 MED ORDER — LIDOCAINE HCL (CARDIAC) PF 100 MG/5ML IV SOSY
PREFILLED_SYRINGE | INTRAVENOUS | Status: DC | PRN
  Administered 2022-12-10: 100 mg via INTRAVENOUS

## 2022-12-10 MED ORDER — CHLORHEXIDINE GLUCONATE 0.12 % MT SOLN
15.0000 mL | Freq: Once | OROMUCOSAL | Status: AC
  Administered 2022-12-10: 15 mL via OROMUCOSAL

## 2022-12-10 MED ORDER — LIDOCAINE HCL (PF) 2 % IJ SOLN
INTRAMUSCULAR | Status: AC
  Filled 2022-12-10: qty 5

## 2022-12-10 MED ORDER — DEXAMETHASONE SODIUM PHOSPHATE 10 MG/ML IJ SOLN
INTRAMUSCULAR | Status: DC | PRN
  Administered 2022-12-10: 5 mg via INTRAVENOUS

## 2022-12-10 MED ORDER — CHLORHEXIDINE GLUCONATE 0.12 % MT SOLN
OROMUCOSAL | Status: AC
  Filled 2022-12-10: qty 15

## 2022-12-10 MED ORDER — ONDANSETRON HCL 4 MG/2ML IJ SOLN
INTRAMUSCULAR | Status: DC | PRN
  Administered 2022-12-10: 4 mg via INTRAVENOUS

## 2022-12-10 MED ORDER — ORAL CARE MOUTH RINSE: 15.0000 mL | Freq: Once | OROMUCOSAL | Status: AC

## 2022-12-10 MED ORDER — CEFAZOLIN SODIUM-DEXTROSE 2-4 GM/100ML-% IV SOLN
2.0000 g | Freq: Three times a day (TID) | INTRAVENOUS | Status: DC
  Administered 2022-12-10: 2 g via INTRAVENOUS

## 2022-12-10 SURGICAL SUPPLY — 28 items
BAG DRN RND TRDRP ANRFLXCHMBR (UROLOGICAL SUPPLIES) ×1
BAG URINE DRAIN 2000ML AR STRL (UROLOGICAL SUPPLIES) ×1 IMPLANT
BLADE CLIPPER SURG (BLADE) ×1 IMPLANT
BRUSH SCRUB EZ 1% IODOPHOR (MISCELLANEOUS) ×1 IMPLANT
CATH FOL 2WAY LX 16X5 (CATHETERS) ×1 IMPLANT
COVER BACK TABLE REUSABLE LG (DRAPES) ×1 IMPLANT
DRAPE INCISE 23X17 STRL (DRAPES) ×1 IMPLANT
DRAPE INCISE IOBAN 23X17 STRL (DRAPES) ×1 IMPLANT
DRAPE UNDER BUTTOCK W/FLU (DRAPES) ×1 IMPLANT
DRSG TELFA 3X8 NADH STRL (GAUZE/BANDAGES/DRESSINGS) ×1 IMPLANT
GAUZE 4X4 16PLY ~~LOC~~+RFID DBL (SPONGE) ×2 IMPLANT
GLOVE BIO SURGEON STRL SZ7.5 (GLOVE) ×2 IMPLANT
GLOVE BIOGEL PI IND STRL 7.5 (GLOVE) ×2 IMPLANT
GOWN STRL REUS W/ TWL LRG LVL3 (GOWN DISPOSABLE) ×1 IMPLANT
GOWN STRL REUS W/ TWL XL LVL3 (GOWN DISPOSABLE) ×2 IMPLANT
GOWN STRL REUS W/TWL LRG LVL3 (GOWN DISPOSABLE) ×1
GOWN STRL REUS W/TWL XL LVL3 (GOWN DISPOSABLE) ×2
IV NS 1000ML (IV SOLUTION) ×1
IV NS 1000ML BAXH (IV SOLUTION) ×1 IMPLANT
KIT TURNOVER CYSTO (KITS) ×1 IMPLANT
MANIFOLD NEPTUNE II (INSTRUMENTS) ×1 IMPLANT
PACK CYSTO AR (MISCELLANEOUS) ×1 IMPLANT
SET CYSTO W/LG BORE CLAMP LF (SET/KITS/TRAYS/PACK) ×1 IMPLANT
SURGILUBE 2OZ TUBE FLIPTOP (MISCELLANEOUS) ×1 IMPLANT
SYR 10ML LL (SYRINGE) ×1 IMPLANT
TRAP FLUID SMOKE EVACUATOR (MISCELLANEOUS) ×1 IMPLANT
WATER STERILE IRR 1000ML POUR (IV SOLUTION) ×1 IMPLANT
WATER STERILE IRR 500ML POUR (IV SOLUTION) ×1 IMPLANT

## 2022-12-10 NOTE — Discharge Instructions (Signed)

## 2022-12-10 NOTE — Anesthesia Postprocedure Evaluation (Signed)
Anesthesia Post Note  Patient: Kevin Holder  Procedure(s) Performed: RADIOACTIVE SEED IMPLANT/BRACHYTHERAPY IMPLANT (Prostate)  Patient location during evaluation: PACU Anesthesia Type: General Level of consciousness: awake and alert Pain management: pain level controlled Vital Signs Assessment: post-procedure vital signs reviewed and stable Respiratory status: spontaneous breathing, nonlabored ventilation and respiratory function stable Cardiovascular status: blood pressure returned to baseline and stable Postop Assessment: no apparent nausea or vomiting Anesthetic complications: no   No notable events documented.   Last Vitals:  Vitals:   12/10/22 0925 12/10/22 0938  BP: (!) 159/97 (!) 165/95  Pulse: 85 82  Resp: 16 16  Temp: 36.8 C   SpO2: 97% 98%    Last Pain:  Vitals:   12/10/22 0938  TempSrc:   PainSc: 0-No pain                 Foye Deer

## 2022-12-10 NOTE — Op Note (Signed)
Preoperative diagnosis: Adenocarcinoma of the prostate    Postoperative diagnosis: Same    Procedure: I-125 prostate seed implantation, cystoscopy   Surgeon: Legrand Rams, MD   Radiation Oncologist: Carmina Miller, M.D.    Anesthesia: General   Drains: none   Complications: none   Indications: Prostate cancer   Procedure: The patient was brought to operating suite and placement table in the supine position. At this time, a universal timeout protocol was performed, all team members were identified, Venodyne boots are placed, and he was administered IV Ancef in the preoperative period. He was placed in lithotomy position and prepped and draped in usual fashion. The radiation oncology department placed a transrectal ultrasound probe anchoring stand/ grid and aligned with previous imaging from the volume study. Foley catheter was inserted without difficulty.  All needle passage was done with real-time transrectal ultrasound guidance in both the transverse and sagittal plains in order to achieve the desired preplanned position. A total of 24 needles were placed.  71 active seeds were implanted. The Foley catheter was removed and a rigid cystoscopy failed to show any seeds outside the prostate without evidence of trauma to the urethral, prostatic fossa, or bladder.  The bladder was drained.  A fluoroscopic image was then obtained showing excellent distrubution of the brachytherapy seeds.  Each seed was counted and counts were correct.     The patient was then repositioned in the supine position, reversed from anesthesia, and taken to the PACU in stable condition.  Legrand Rams, MD 12/10/2022

## 2022-12-10 NOTE — Progress Notes (Signed)
Radiation Oncology I-125 interstitial implant note  Name: Kevin Holder   Date:   10/20/2022 MRN:  [id number] DOB: July 15, 1966    This 57 y.o. male presents to the OR for I-125 interstitial implant for Gleason 7 adenocarcinoma the prostate REFERRING PROVIDER: No ref. provider found  HPI: Patient is a 57 year old male presented with stage IIb (cT1 cN0 M0) Gleason 7 (3+4) adenocarcinoma presenting with a PSA of 7.8.  He opted for I-125 interstitial implant..  COMPLICATIONS OF TREATMENT: none  FOLLOW UP COMPLIANCE: keeps appointments   PHYSICAL EXAM:  BP (!) 165/95   Pulse 82   Temp 98.2 F (36.8 C)   Resp 16   Ht  (1.88 m)   Wt 235 lb 14.3 oz (107 kg)   SpO2 98%   BMI 30.29 kg/m  Well-developed well-nourished patient in NAD. HEENT reveals PERLA, EOMI, discs not visualized.  Oral cavity is clear. No oral mucosal lesions are identified. Neck is clear without evidence of cervical or supraclavicular adenopathy. Lungs are clear to A&P. Cardiac examination is essentially unremarkable with regular rate and rhythm without murmur rub or thrill. Abdomen is benign with no organomegaly or masses noted. Motor sensory and DTR levels are equal and symmetric in the upper and lower extremities. Cranial nerves II through XII are grossly intact. Proprioception is intact. No peripheral adenopathy or edema is identified. No motor or sensory levels are noted. Crude visual fields are within normal range.  RADIOLOGY RESULTS: Ultrasound used for seed placement  PLAN: Patient was taken to the operating room and general anesthesia was administered. Legs were immobilized in stirrups and patient was positioned in the exact same proportions as original volume study. Patient was prepped and Foley catheter was placed. Ultrasound guidance identified the prostate and recreated the original set up as per treatment planning volume study.  A needle grid was attached to the ultrasound probe to position the needles.  24  needles were placed under ultrasound guidance  to the  prostate PTV  . After completion of procedure cystoscopy was performed by urology and no evidence of seeds in the bladder were noted. Patient tolerated the procedure extremely well. Initial plain film as doublecheck identified 71 seeds in the prostate. Patient has followup appointment in one month for CT scan for quality assurance will be performed.Prior to implant 10% or 10 loose seeds which is ever higher were ordered and assayed to verify source strength.     Carmina Miller, MD

## 2022-12-10 NOTE — Anesthesia Procedure Notes (Signed)
Procedure Name: LMA Insertion Date/Time: 12/10/2022 7:57 AM  Performed by: Berniece Pap, CRNAPre-anesthesia Checklist: Patient identified, Emergency Drugs available, Suction available and Patient being monitored Patient Re-evaluated:Patient Re-evaluated prior to induction Oxygen Delivery Method: Circle system utilized Preoxygenation: Pre-oxygenation with 100% oxygen Induction Type: IV induction Ventilation: Mask ventilation without difficulty LMA Size: 5.0 Tube type: Oral Number of attempts: 1 Airway Equipment and Method: Oral airway Placement Confirmation: positive ETCO2 and breath sounds checked- equal and bilateral Tube secured with: Tape Dental Injury: Teeth and Oropharynx as per pre-operative assessment

## 2022-12-10 NOTE — H&P (Signed)
   12/10/22 7:02 AM   Kevin Holder Sep 24, 1965 [id number]  CC: Prostate cancer  HPI: 57 year old male with an elevated PSA of 7.85 that has continued to rise over the last few years.  Using shared decision making, he opted for a prostate MRI, which showed 43 g prostate with PI-RADS 5 lesion in the right anterior stroma and transition zone in the mid gland and apex, no evidence of metastatic disease.  He underwent a fusion biopsy in [place] which showed negative cores in the ROI, however single core on the right side of Gleason score 3+4=7 disease with 20% max core involvement, and 2 additional cores of 3+3=6.   He has diabetes with a hemoglobin A1c of 9 but is otherwise relatively healthy.  Denies any significant urinary symptoms at baseline, very mild ED, occasionally will take medications.  No prior abdominal surgeries.  After meeting with radiation oncology he opted to pursue brachytherapy.  Has already received his 68-month ADT injection.     PMH: Past Medical History:  Diagnosis Date   Anemia    as a child   DM (diabetes mellitus), type 2    ED (erectile dysfunction)    GERD (gastroesophageal reflux disease)    occ-tums prn   Hyperlipidemia    Hypertension    Prostate cancer     Surgical History: Past Surgical History:  Procedure Laterality Date   HEMORROIDECTOMY     MOUTH SURGERY      Family History: History reviewed. No pertinent family history.  Social History:  reports that he quit smoking about 2 years ago. His smoking use included cigarettes. He smoked an average of .5 packs per day. He quit smokeless tobacco use about 13 months ago. He reports current alcohol use. He reports that he does not use drugs.  Physical Exam: BP (!) 184/87   Pulse 88   Temp 97.8 F (36.6 C) (Oral)   Resp 16   Ht  (1.88 m)   Wt 107 kg   SpO2 97%   BMI 30.29 kg/m    Constitutional:  Alert and oriented, No acute distress. Cardiovascular: Regular rate and  rhythm Respiratory: Clear to auscultation bilaterally GI: Abdomen is soft, nontender, nondistended, no abdominal masses   Assessment & Plan:   57 year old male with favorable intermediate risk prostate cancer who opted for brachytherapy.  We discussed the risks and benefits including bleeding, infection, urinary symptoms, retention, possible need for Foley catheter, risk of recurrence in the future, need for long-term PSA monitoring.  Brachytherapy today in conjunction with radiation oncology   Legrand Rams, MD 12/10/2022  [facility] [address] [place], [place] [address] [phone]

## 2022-12-10 NOTE — Transfer of Care (Signed)
Immediate Anesthesia Transfer of Care Note  Patient: Kevin Holder  Procedure(s) Performed: RADIOACTIVE SEED IMPLANT/BRACHYTHERAPY IMPLANT (Prostate)  Patient Location: PACU  Anesthesia Type:General  Level of Consciousness: awake and alert   Airway & Oxygen Therapy: Patient Spontanous Breathing and Patient connected to nasal cannula oxygen  Post-op Assessment: Report given to RN and Post -op Vital signs reviewed and stable  Post vital signs: Reviewed and stable  Last Vitals:  Vitals Value Taken Time  BP    Temp    Pulse    Resp    SpO2      Last Pain:  Vitals:   12/10/22 0634  TempSrc: Oral  PainSc: 0-No pain         Complications: No notable events documented.

## 2022-12-10 NOTE — Anesthesia Preprocedure Evaluation (Addendum)
Anesthesia Evaluation  Patient identified by MRN, date of birth, ID band Patient awake    Reviewed: Allergy & Precautions, H&P , NPO status , Patient's Chart, lab work & pertinent test results  Airway Mallampati: III  TM Distance: >3 FB Neck ROM: full    Dental  (+) Missing   Pulmonary former smoker   Pulmonary exam normal        Cardiovascular hypertension, Normal cardiovascular exam     Neuro/Psych negative neurological ROS  negative psych ROS   GI/Hepatic Neg liver ROS,GERD  Controlled,,  Endo/Other  diabetes, Well Controlled, Type 2    Renal/GU      Musculoskeletal   Abdominal  (+) + obese  Peds  Hematology negative hematology ROS (+)   Anesthesia Other Findings Prostate cancer  Past Medical History: No date: Anemia     Comment:  as a child No date: DM (diabetes mellitus), type 2 No date: ED (erectile dysfunction) No date: GERD (gastroesophageal reflux disease)     Comment:  occ-tums prn No date: Hyperlipidemia No date: Hypertension No date: Prostate cancer  Past Surgical History: No date: HEMORROIDECTOMY No date: MOUTH SURGERY  BMI    Body Mass Index: 30.29 kg/m      Reproductive/Obstetrics negative OB ROS                             Anesthesia Physical Anesthesia Plan  ASA: 2  Anesthesia Plan: General ETT   Post-op Pain Management: Ofirmev IV (intra-op)* and Toradol IV (intra-op)*   Induction: Intravenous  PONV Risk Score and Plan: 2 and Ondansetron, Dexamethasone and Midazolam  Airway Management Planned: LMA  Additional Equipment:   Intra-op Plan:   Post-operative Plan: Extubation in OR  Informed Consent: I have reviewed the patients History and Physical, chart, labs and discussed the procedure including the risks, benefits and alternatives for the proposed anesthesia with the patient or authorized representative who has indicated his/her understanding  and acceptance.     Dental Advisory Given  Plan Discussed with: CRNA and Surgeon  Anesthesia Plan Comments:         Anesthesia Quick Evaluation

## 2022-12-11 ENCOUNTER — Encounter: Payer: Self-pay | Admitting: Urology

## 2022-12-13 NOTE — Telephone Encounter (Signed)
Fax confirmation obtained

## 2022-12-13 NOTE — Telephone Encounter (Signed)
FMLA signed and patient notified of this. She asked that I fax it to her at her work. [phone]. Faxed, awaiting confirmation of reciept

## 2023-01-05 ENCOUNTER — Ambulatory Visit
Admission: RE | Admit: 2023-01-05 | Discharge: 2023-01-05 | Disposition: A | Discharge status: Home / Self Care | Payer: BC Managed Care – PPO | Source: Ambulatory Visit | Attending: Radiation Oncology | Admitting: Radiation Oncology

## 2023-01-05 VITALS — BP 154/90 | HR 109 | Temp 98.6°F | Resp 16 | Wt 241.2 lb

## 2023-01-05 DIAGNOSIS — R5383 Other fatigue: Secondary | ICD-10-CM | POA: Insufficient documentation

## 2023-01-05 DIAGNOSIS — Z923 Personal history of irradiation: Secondary | ICD-10-CM | POA: Diagnosis not present

## 2023-01-05 DIAGNOSIS — C61 Malignant neoplasm of prostate: Secondary | ICD-10-CM

## 2023-01-05 NOTE — Progress Notes (Signed)
Radiation Oncology Follow up Note  Name: Kevin Holder   Date:   01/05/2023 MRN:  [id number] DOB: 02/16/1966    This 57 y.o. male presents to the clinic today for    1 month follow-up status post I-125 interstitial implant for Gleason 7 adenocarcinoma of the prostate  REFERRING PROVIDER: Rolm Gala, MD  HPI: Patient is a 57 year old male now about 1 month having completed I-125 interstitial implant for a Gleason 7 (3+4) adenocarcinoma presenting with a PSA of 7 point seen today in routine follow-up he is doing fairly well he states he is having no significant increase in lower urinary tract symptoms no diarrhea does have some slight fatigue although he attributes that possibly to ADT therapy..  COMPLICATIONS OF TREATMENT: none  FOLLOW UP COMPLIANCE: keeps appointments   PHYSICAL EXAM:  BP (!) 154/90 (BP Location: Left Arm, Patient Position: Sitting, Cuff Size: Normal)   Pulse (!) 109   Temp 98.6 F (37 C) (Tympanic)   Resp 16   Wt 241 lb 3.2 oz (109.4 kg)   SpO2 99%   BMI 30.97 kg/m  Well-developed well-nourished patient in NAD. HEENT reveals PERLA, EOMI, discs not visualized.  Oral cavity is clear. No oral mucosal lesions are identified. Neck is clear without evidence of cervical or supraclavicular adenopathy. Lungs are clear to A&P. Cardiac examination is essentially unremarkable with regular rate and rhythm without murmur rub or thrill. Abdomen is benign with no organomegaly or masses noted. Motor sensory and DTR levels are equal and symmetric in the upper and lower extremities. Cranial nerves II through XII are grossly intact. Proprioception is intact. No peripheral adenopathy or edema is identified. No motor or sensory levels are noted. Crude visual fields are within normal range.  RADIOLOGY RESULTS: CT scan shows excellent placement of sources no evidence of seed migration.  PLAN: Present time patient is doing well low side effect profile from I-125 interstitial implant.  I  have asked to see him back in 3 months with a follow-up PSA.  Patient knows to call in the meantime with any concerns.  I would like to take this opportunity to thank you for allowing me to participate in the care of your patient.Carmina Miller, MD

## 2023-01-10 DIAGNOSIS — C61 Malignant neoplasm of prostate: Secondary | ICD-10-CM | POA: Diagnosis not present

## 2023-01-11 DIAGNOSIS — C61 Malignant neoplasm of prostate: Secondary | ICD-10-CM | POA: Diagnosis not present

## 2023-01-24 ENCOUNTER — Telehealth: Payer: Self-pay | Admitting: *Deleted

## 2023-01-24 ENCOUNTER — Other Ambulatory Visit: Payer: Self-pay | Admitting: *Deleted

## 2023-01-24 NOTE — Telephone Encounter (Signed)
Patient called reporting that he had seed implants 5 weeks ago and for the past 3 weeks he has has full stream urinary frequency and it has increased to 6 times an hour the past 2 days. He is asking if this is normal. Please advise

## 2023-01-25 ENCOUNTER — Other Ambulatory Visit: Payer: Self-pay | Admitting: Urology

## 2023-01-26 ENCOUNTER — Other Ambulatory Visit: Payer: Self-pay

## 2023-01-26 DIAGNOSIS — C61 Malignant neoplasm of prostate: Secondary | ICD-10-CM

## 2023-01-26 MED ORDER — TAMSULOSIN HCL 0.4 MG PO CAPS
ORAL_CAPSULE | ORAL | 3 refills | Status: AC
Start: 2023-01-26 — End: ?

## 2023-01-28 ENCOUNTER — Encounter: Payer: Self-pay | Admitting: Physician Assistant

## 2023-01-28 ENCOUNTER — Ambulatory Visit (INDEPENDENT_AMBULATORY_CARE_PROVIDER_SITE_OTHER): Payer: BC Managed Care – PPO | Admitting: Physician Assistant

## 2023-01-28 VITALS — BP 130/80 | HR 128 | Ht 74.0 in | Wt 240.0 lb

## 2023-01-28 DIAGNOSIS — R350 Frequency of micturition: Secondary | ICD-10-CM | POA: Diagnosis not present

## 2023-01-28 DIAGNOSIS — R3915 Urgency of urination: Secondary | ICD-10-CM | POA: Diagnosis not present

## 2023-01-28 LAB — URINALYSIS, COMPLETE
Bilirubin, UA: NEGATIVE
Glucose, UA: NEGATIVE
Leukocytes,UA: NEGATIVE
Nitrite, UA: NEGATIVE
Specific Gravity, UA: >1.03 — ABNORMAL HIGH (ref 1.005–1.030)
Urobilinogen, Ur: 0.2 mg/dL (ref 0.2–1.0)
pH, UA: 5 (ref 5.0–7.5)

## 2023-01-28 LAB — MICROSCOPIC EXAMINATION: Bacteria, UA: NONE SEEN

## 2023-01-28 LAB — BLADDER SCAN AMB NON-IMAGING

## 2023-01-28 MED ORDER — GEMTESA 75 MG PO TABS
75.0000 mg | ORAL_TABLET | Freq: Every day | ORAL | 0 refills | Status: AC
Start: 2023-01-28 — End: ?

## 2023-01-28 NOTE — Progress Notes (Unsigned)
01/28/2023 1:36 PM   Kevin Holder [birth year] [id number]  CC: Chief Complaint  Patient presents with   Follow-up   Urinary Frequency   HPI: Kevin Holder is a 57 y.o. male with favorable intermediate risk prostate cancer who underwent brachytherapy with Dr. Richardo Hanks on 12/10/2022 who presents today for evaluation of urgency and frequency.   Today he reports an onset urinary urgency and frequency 9 days ago.  He denies dysuria.  He took Flomax immediately following brachyseed placement but stopped it after couple of weeks.  He resumed it last week when his symptoms suddenly worsened.  He notes that things have resolved as of this morning but he wants to make sure he does not have an infection.  In-office UA today positive for trace ketones, trace lysed blood, and 1+ protein; urine microscopy pan negative. PVR 0mL.  PMH: Past Medical History:  Diagnosis Date   Anemia    as a child   DM (diabetes mellitus), type 2 (HCC)    ED (erectile dysfunction)    GERD (gastroesophageal reflux disease)    occ-tums prn   Hyperlipidemia    Hypertension    Prostate cancer (HCC)     Surgical History: Past Surgical History:  Procedure Laterality Date   HEMORROIDECTOMY     MOUTH SURGERY     RADIOACTIVE SEED IMPLANT N/A 12/10/2022   Procedure: RADIOACTIVE SEED IMPLANT/BRACHYTHERAPY IMPLANT;  Surgeon: Sondra Come, MD;  Location: [facility] ORS;  Service: Urology;  Laterality: N/A;    Home Medications:  Allergies as of 01/28/2023       Reactions   Lisinopril Cough        Medication List        Accurate as of January 28, 2023  1:36 PM. If you have any questions, ask your nurse or doctor.          calcium carbonate 500 MG chewable tablet Commonly known as: TUMS - dosed in mg elemental calcium Chew 1 tablet by mouth daily as needed for indigestion or heartburn.   CALCIUM-VITAMIN D PO Take 1 tablet by mouth 2 (two) times daily.   citalopram 20 MG tablet Commonly known as:  CELEXA Take 20 mg by mouth daily before lunch.   diphenhydrAMINE 25 mg capsule Commonly known as: BENADRYL Take 50 mg by mouth every 6 (six) hours as needed for sleep.   glipiZIDE 10 MG tablet Commonly known as: GLUCOTROL Take 10 mg by mouth 2 (two) times daily before a meal.   ketoconazole 2 % shampoo Commonly known as: NIZORAL Apply 1 Application topically 2 (two) times a week.   LORazepam 1 MG tablet Commonly known as: ATIVAN Take 1 mg by mouth 2 (two) times daily as needed for anxiety.   losartan 25 MG tablet Commonly known as: COZAAR Take 1 tablet by mouth daily before lunch.   metFORMIN 500 MG tablet Commonly known as: GLUCOPHAGE Take 1,000 mg by mouth 2 (two) times daily with a meal.   Precision QID Test test strip Generic drug: glucose blood 1 each.   rosuvastatin 40 MG tablet Commonly known as: CRESTOR Take 40 mg by mouth daily before lunch.   sildenafil 100 MG tablet Commonly known as: VIAGRA Take 100 mg by mouth as needed for erectile dysfunction.   tamsulosin 0.4 MG Caps capsule Commonly known as: FLOMAX TAKE 1 CAPSULE BY MOUTH ONCE DAILY AFTER SUPPER        Allergies:  Allergies  Allergen Reactions   Lisinopril Cough  Family History: No family history on file.  Social History:   reports that he quit smoking about 2 years ago. His smoking use included cigarettes. He smoked an average of .5 packs per day. He has been exposed to tobacco smoke. He quit smokeless tobacco use about 15 months ago. He reports current alcohol use. He reports that he does not use drugs.  Physical Exam: BP 130/80   Pulse (!) 128   Ht 6\' 2"  (1.88 m)   Wt 240 lb (108.9 kg)   BMI 30.81 kg/m   Constitutional:  Alert and oriented, no acute distress, nontoxic appearing HEENT: [place], AT Cardiovascular: No clubbing, cyanosis, or edema Respiratory: Normal respiratory effort, no increased work of breathing Skin: No rashes, bruises or suspicious lesions Neurologic: Grossly  intact, no focal deficits, moving all 4 extremities Psychiatric: Normal mood and affect  Laboratory Data: Results for orders placed or performed in visit on 01/28/23  Microscopic Examination   Urine  Result Value Ref Range   WBC, UA 0-5 0 - 5 /hpf   RBC, Urine 0-2 0 - 2 /hpf   Epithelial Cells (non renal) 0-10 0 - 10 /hpf   Casts Present (A) None seen /lpf   Cast Type Hyaline casts N/A   Bacteria, UA None seen None seen/Few  Urinalysis, Complete  Result Value Ref Range   Specific Gravity, UA >1.030 (H) 1.005 - 1.030   pH, UA 5.0 5.0 - 7.5   Color, UA Yellow Yellow   Appearance Ur Clear Clear   Leukocytes,UA Negative Negative   Protein,UA 1+ (A) Negative/Trace   Glucose, UA Negative Negative   Ketones, UA Trace (A) Negative   RBC, UA Trace (A) Negative   Bilirubin, UA Negative Negative   Urobilinogen, Ur 0.2 0.2 - 1.0 mg/dL   Nitrite, UA Negative Negative   Microscopic Examination See below:   BLADDER SCAN AMB NON-IMAGING  Result Value Ref Range   Scan Result 0ml    Assessment & Plan:   1. Urinary urgency He is emptying appropriately and UA is bland.  His symptoms have resolved as of this morning after resuming Flomax last week.  I do think that his symptoms likely represent side effect of brachytherapy insertion and that he is appropriately treating this with Flomax.  I encouraged him to continue this.  I also gave him samples of Gemtesa to take if his symptoms worsen.  He is in agreement with this plan. - Urinalysis, Complete - BLADDER SCAN AMB NON-IMAGING - Vibegron (GEMTESA) 75 MG TABS; Take 1 tablet (75 mg total) by mouth daily.  Dispense: 28 tablet; Refill: 0   Return if symptoms worsen or fail to improve.  Carman Ching, PA-C  [facility] [place] [address], [address] [place], [place] [address] [phone]

## 2023-02-01 ENCOUNTER — Other Ambulatory Visit: Payer: Self-pay | Admitting: *Deleted

## 2023-02-01 NOTE — Telephone Encounter (Signed)
From: [name] To: Office of Sondra Come, MD Sent: 01/24/2023 9:42 AM EDT Subject: Medication Renewal Request  Refills have been requested for the following medications:   tamsulosin (FLOMAX) 0.4 MG CAPS capsule [Brian C Sninsky]  Patient Comment:   Preferred pharmacy: [id number] - [place], [place] - [address] [place] [address] Delivery method: [employer]

## 2023-04-08 ENCOUNTER — Other Ambulatory Visit: Payer: Self-pay | Admitting: *Deleted

## 2023-04-08 DIAGNOSIS — C61 Malignant neoplasm of prostate: Secondary | ICD-10-CM

## 2023-04-11 ENCOUNTER — Inpatient Hospital Stay: Payer: BC Managed Care – PPO | Attending: Radiation Oncology

## 2023-04-11 DIAGNOSIS — C61 Malignant neoplasm of prostate: Secondary | ICD-10-CM | POA: Insufficient documentation

## 2023-04-11 LAB — PSA: Prostatic Specific Antigen: <0.01 ng/mL (ref 0.00–4.00)

## 2023-04-18 ENCOUNTER — Encounter: Payer: Self-pay | Admitting: Radiation Oncology

## 2023-04-18 ENCOUNTER — Ambulatory Visit
Admission: RE | Admit: 2023-04-18 | Discharge: 2023-04-18 | Disposition: A | Discharge status: Home / Self Care | Payer: BC Managed Care – PPO | Source: Ambulatory Visit | Attending: Radiation Oncology | Admitting: Radiation Oncology

## 2023-04-18 VITALS — BP 129/87 | HR 99 | Temp 98.2°F | Resp 18 | Ht 74.0 in | Wt 243.0 lb

## 2023-04-18 DIAGNOSIS — C61 Malignant neoplasm of prostate: Secondary | ICD-10-CM | POA: Diagnosis present

## 2023-04-18 DIAGNOSIS — Z923 Personal history of irradiation: Secondary | ICD-10-CM | POA: Insufficient documentation

## 2023-04-18 NOTE — Progress Notes (Signed)
Radiation Oncology Follow up Note  Name: Kevin Holder   Date:   04/18/2023 MRN:  [id number] DOB: 15-Oct-1965    This 57 y.o. male presents to the clinic today for 3-month follow-up status post I-125 interstitial implant for Gleason 7 adenocarcinoma prostate.  REFERRING PROVIDER: Rolm Gala, MD  HPI: Patient is a 57 year old male now out for months having completed I-125 interstitial implant for Gleason 7 adenocarcinoma.  Seen today in routine follow-up he is doing fairly well still has some slight loose stools not following any low residue diet has not been taking Imodium.  Also he is on Flomax taken at the daytime seems to be helping with his frequency during the day.  He has nocturia x 3-4 most recent PSA was 0.01.  COMPLICATIONS OF TREATMENT: none  FOLLOW UP COMPLIANCE: keeps appointments   PHYSICAL EXAM:  BP 129/87   Pulse 99   Temp 98.2 F (36.8 C)   Resp 18   Ht 6\' 2"  (1.88 m)   Wt 243 lb (110.2 kg)   BMI 31.20 kg/m  Well-developed well-nourished patient in NAD. HEENT reveals PERLA, EOMI, discs not visualized.  Oral cavity is clear. No oral mucosal lesions are identified. Neck is clear without evidence of cervical or supraclavicular adenopathy. Lungs are clear to A&P. Cardiac examination is essentially unremarkable with regular rate and rhythm without murmur rub or thrill. Abdomen is benign with no organomegaly or masses noted. Motor sensory and DTR levels are equal and symmetric in the upper and lower extremities. Cranial nerves II through XII are grossly intact. Proprioception is intact. No peripheral adenopathy or edema is identified. No motor or sensory levels are noted. Crude visual fields are within normal range.  RADIOLOGY RESULTS: No current films for review  PLAN: Present time patient is doing well under excellent biochemical control of his prostate cancer 4 months out from I-125 interstitial implant.  I am giving him a low residue diet sheet to try to follow.  Also  he can take Imodium I have also recommended activity yogurt.  Will see him back in 6 months for follow-up with repeat PSA.  Patient knows to call with any concerns.  I would like to take this opportunity to thank you for allowing me to participate in the care of your patient.Carmina Miller, MD

## 2023-05-24 ENCOUNTER — Ambulatory Visit (INDEPENDENT_AMBULATORY_CARE_PROVIDER_SITE_OTHER): Payer: BC Managed Care – PPO | Admitting: Urology

## 2023-05-24 ENCOUNTER — Encounter: Payer: Self-pay | Admitting: Urology

## 2023-05-24 VITALS — BP 179/90 | HR 103 | Ht 74.0 in | Wt 242.0 lb

## 2023-05-24 DIAGNOSIS — R350 Frequency of micturition: Secondary | ICD-10-CM

## 2023-05-24 DIAGNOSIS — C61 Malignant neoplasm of prostate: Secondary | ICD-10-CM

## 2023-05-24 DIAGNOSIS — Z8546 Personal history of malignant neoplasm of prostate: Secondary | ICD-10-CM

## 2023-05-24 DIAGNOSIS — R3915 Urgency of urination: Secondary | ICD-10-CM

## 2023-05-24 LAB — BLADDER SCAN AMB NON-IMAGING

## 2023-05-24 MED ORDER — TAMSULOSIN HCL 0.4 MG PO CAPS: ORAL_CAPSULE | ORAL | 3 refills | Status: DC

## 2023-05-24 MED ORDER — SILDENAFIL CITRATE 100 MG PO TABS: 100.0000 mg | ORAL_TABLET | ORAL | 11 refills | Status: AC | PRN

## 2023-05-24 NOTE — Progress Notes (Signed)
05/24/2023 2:14 PM   Kevin Holder 1965-10-23 [id number]  Reason for visit: Follow up prostate cancer, urinary symptoms  HPI: 57 year old male who presented with an elevated PSA of 7.8 and opted for a prostate MRI.  This showed a 43 g prostate with a PI-RADS 5 lesion in the right anterior stroma and transition zone, fusion biopsy in [facility] showed 3+4 equal 7 disease with 20% max core involvement, and 2 additional cores of low risk disease.  He also has poorly controlled diabetes.  He underwent treatment with 6 months of ADT injection followed by brachytherapy with Dr. Rushie Chestnut in 12/10/2022.  He had some problems with urgency and frequency as well as intermittent diarrhea for the first few weeks to months postoperatively, but those have continued to improve.  He remains on Flomax twice daily, really denies any urinary complaints today.  We reviewed the correlation between poorly controlled diabetes(hemoglobin A1c 9) and urinary frequency.  Urinalysis in June 2024 was benign.  PVR today normal at 0ml.  He has not been sexually active since his procedure, was taking 100 mg sildenafil on demand prior to treatment for prostate cancer.  PSA in August 2024 <0.01 consistent with excellent treatment response.  We discussed the importance of monitoring the PSA, as well as the concept of a PSA balance.  Continue Flomax, can take daily instead of twice daily, can consider discontinuing as well and monitoring symptoms Continue sildenafil 100 mg on demand for ED RTC with me 1 year PSA prior, has 67-month follow-up with Dr. Rushie Chestnut for PSA  Sondra Come, MD  [facility] [address] [place], [place] [address] [phone]

## 2023-06-07 ENCOUNTER — Telehealth: Payer: Self-pay

## 2023-06-07 NOTE — Telephone Encounter (Signed)
Patient brought FMLA paperwork by office to be completed. Per Dr. Richardo Hanks no additional FMLA is medically needed and therefore forms will not be completed. Pt informed of this. Additionally patient has FMLA that needs to be back dated to cover him going over intermittent allowance. After reviewing this paperwork it states that the deadline for this to be completed was 03/06/2023. Please see media for scanned documents.

## 2023-10-13 ENCOUNTER — Other Ambulatory Visit: Payer: Self-pay | Admitting: *Deleted

## 2023-10-13 DIAGNOSIS — C61 Malignant neoplasm of prostate: Secondary | ICD-10-CM

## 2023-10-19 ENCOUNTER — Inpatient Hospital Stay: Payer: BC Managed Care – PPO | Attending: Radiation Oncology

## 2023-10-19 DIAGNOSIS — C61 Malignant neoplasm of prostate: Secondary | ICD-10-CM | POA: Insufficient documentation

## 2023-10-19 LAB — PSA: Prostatic Specific Antigen: 0.03 ng/mL (ref 0.00–4.00)

## 2023-10-26 ENCOUNTER — Ambulatory Visit
Admission: RE | Admit: 2023-10-26 | Discharge: 2023-10-26 | Disposition: A | Discharge status: Home / Self Care | Payer: BC Managed Care – PPO | Source: Ambulatory Visit | Attending: Radiation Oncology | Admitting: Radiation Oncology

## 2023-10-26 ENCOUNTER — Other Ambulatory Visit: Payer: Self-pay | Admitting: *Deleted

## 2023-10-26 ENCOUNTER — Encounter: Payer: Self-pay | Admitting: Radiation Oncology

## 2023-10-26 VITALS — BP 147/77 | HR 93 | Temp 97.5°F | Resp 16 | Wt 250.0 lb

## 2023-10-26 DIAGNOSIS — C61 Malignant neoplasm of prostate: Secondary | ICD-10-CM | POA: Diagnosis present

## 2023-10-26 DIAGNOSIS — Z923 Personal history of irradiation: Secondary | ICD-10-CM | POA: Insufficient documentation

## 2023-10-26 NOTE — Progress Notes (Signed)
 Radiation Oncology Follow up Note  Name: Kevin Holder   Date:   10/26/2023 MRN:  [id number] DOB: 04-Apr-1966    This 58 y.o. male presents to the clinic today for 51-month follow-up status post I-125 interstitial implant for Gleason 7 adenocarcinoma the prostate.  REFERRING PROVIDER: Rolm Gala, MD  HPI: Patient is a 58 year old male now out 10 months having completed I-125 interstitial implant for Gleason 7 adenocarcinoma the prostate.  Seen today in follow-up he is doing well.  He specifically denies any increased lower urinary tract symptoms diarrhea or fatigue.  His most recent PSA is.  0.03.  It was 0.016 months prior.  COMPLICATIONS OF TREATMENT: none  FOLLOW UP COMPLIANCE: keeps appointments   PHYSICAL EXAM:  BP (!) 147/77   Pulse 93   Temp (!) 97.5 F (36.4 C)   Resp 16   Wt 250 lb (113.4 kg)   BMI 32.10 kg/m  Well-developed well-nourished patient in NAD. HEENT reveals PERLA, EOMI, discs not visualized.  Oral cavity is clear. No oral mucosal lesions are identified. Neck is clear without evidence of cervical or supraclavicular adenopathy. Lungs are clear to A&P. Cardiac examination is essentially unremarkable with regular rate and rhythm without murmur rub or thrill. Abdomen is benign with no organomegaly or masses noted. Motor sensory and DTR levels are equal and symmetric in the upper and lower extremities. Cranial nerves II through XII are grossly intact. Proprioception is intact. No peripheral adenopathy or edema is identified. No motor or sensory levels are noted. Crude visual fields are within normal range.  RADIOLOGY RESULTS: No current films to review  PLAN: Time patient is under excellent biochemical control of his prostate cancer with very low side effect profile.  I am pleased with his overall progress.  Of asked to see him back in 6 months for follow-up with repeat PSA.  Patient knows to call sooner with any concerns.  I would like to take this opportunity to  thank you for allowing me to participate in the care of your patient.Carmina Miller, MD

## 2024-03-07 ENCOUNTER — Encounter: Payer: Self-pay | Admitting: Urology

## 2024-04-19 ENCOUNTER — Inpatient Hospital Stay: Attending: Radiation Oncology

## 2024-04-19 DIAGNOSIS — C61 Malignant neoplasm of prostate: Secondary | ICD-10-CM | POA: Insufficient documentation

## 2024-04-19 LAB — PSA: Prostatic Specific Antigen: <0.02 ng/mL (ref 0.00–4.00)

## 2024-04-20 DIAGNOSIS — C61 Malignant neoplasm of prostate: Secondary | ICD-10-CM

## 2024-04-20 MED ORDER — TAMSULOSIN HCL 0.4 MG PO CAPS
ORAL_CAPSULE | ORAL | 3 refills | Status: DC
Start: 2024-04-20 — End: 2024-05-22

## 2024-04-26 ENCOUNTER — Encounter: Payer: Self-pay | Admitting: Radiation Oncology

## 2024-04-26 ENCOUNTER — Ambulatory Visit
Admission: RE | Admit: 2024-04-26 | Discharge: 2024-04-26 | Disposition: A | Discharge status: Home / Self Care | Payer: Self-pay | Source: Ambulatory Visit | Attending: Radiation Oncology | Admitting: Radiation Oncology

## 2024-04-26 VITALS — BP 135/96 | HR 116 | Resp 16 | Ht 74.0 in | Wt 226.0 lb

## 2024-04-26 DIAGNOSIS — C61 Malignant neoplasm of prostate: Secondary | ICD-10-CM | POA: Insufficient documentation

## 2024-04-26 DIAGNOSIS — Z923 Personal history of irradiation: Secondary | ICD-10-CM | POA: Insufficient documentation

## 2024-04-26 NOTE — Progress Notes (Signed)
 Radiation Oncology Follow up Note  Name: Kevin Holder   Date:   04/26/2024 MRN:  [id number] DOB: [birth year]    This 58 y.o. male presents to the clinic today for 25-month follow-up status post I-125 interstitial implant for Gleason 7 adenocarcinoma the prostate.  REFERRING PROVIDER: Rojelio Loader, MD  HPI: Patient is a 58 year old male now out 16 months having pleated I-125 interstitial implant for Gleason 7 adenocarcinoma the prostate.  Seen today in routine follow-up he specifically denies any increased lower urinary tract symptoms diarrhea or fatigue.  His most recent PSA.Is down slightly from 6 months ago is now 0.02 from 0.03.  COMPLICATIONS OF TREATMENT: none  FOLLOW UP COMPLIANCE: keeps appointments   PHYSICAL EXAM:  BP (!) 135/96   Pulse (!) 116   Resp 16   Ht 6' 2 (1.88 m)   Wt 226 lb (102.5 kg)   BMI 29.02 kg/m  Well-developed well-nourished patient in NAD. HEENT reveals PERLA, EOMI, discs not visualized.  Oral cavity is clear. No oral mucosal lesions are identified. Neck is clear without evidence of cervical or supraclavicular adenopathy. Lungs are clear to A&P. Cardiac examination is essentially unremarkable with regular rate and rhythm without murmur rub or thrill. Abdomen is benign with no organomegaly or masses noted. Motor sensory and DTR levels are equal and symmetric in the upper and lower extremities. Cranial nerves II through XII are grossly intact. Proprioception is intact. No peripheral adenopathy or edema is identified. No motor or sensory levels are noted. Crude visual fields are within normal range.  RADIOLOGY RESULTS: No current films for review at the present time patient is doing well under excellent biochemical control of his prostate cancer.  I am pleased with his overall progress.  I have asked to see him back in 1 year for follow-up.  Will alternate with urology every 6 months.  Patient knows to call sooner with any concerns.  I would like to take this  opportunity to thank you for allowing me to participate in the care of your patient.SABRA  PLAN: As above    Marcey Penton, MD

## 2024-05-22 ENCOUNTER — Ambulatory Visit: Admitting: Urology

## 2024-05-22 VITALS — BP 121/74 | HR 104 | Wt 220.0 lb

## 2024-05-22 DIAGNOSIS — N529 Male erectile dysfunction, unspecified: Secondary | ICD-10-CM

## 2024-05-22 DIAGNOSIS — R399 Unspecified symptoms and signs involving the genitourinary system: Secondary | ICD-10-CM | POA: Diagnosis not present

## 2024-05-22 DIAGNOSIS — C61 Malignant neoplasm of prostate: Secondary | ICD-10-CM

## 2024-05-22 MED ORDER — TADALAFIL 20 MG PO TABS: 20.0000 mg | ORAL_TABLET | Freq: Every day | ORAL | 6 refills | Status: AC | PRN

## 2024-05-22 MED ORDER — TAMSULOSIN HCL 0.4 MG PO CAPS
ORAL_CAPSULE | ORAL | 3 refills | Status: AC
Start: 2024-05-22 — End: ?

## 2024-05-22 NOTE — Progress Notes (Signed)
   05/22/2024 12:59 PM   Kevin Holder 1965/12/05 [id number]  Reason for visit: Follow up prostate cancer, urinary symptoms, ED  History: Elevated PSA of 7.8, prostate MRI with PI-RADS 5 lesion, 43 g gland, fusion biopsy showed favorable intermediate risk disease Treated with 6 months ADT and brachytherapy April 2024 PSA has remained low since that time ED  on sildenafil  Diabetes, hemoglobin A1c has improved  Physical Exam: BP 121/74 (BP Location: Left Arm, Patient Position: Sitting, Cuff Size: Normal)   Pulse (!) 104   Wt 220 lb (99.8 kg)   SpO2 98%   BMI 28.25 kg/m    Imaging/labs: Hemoglobin A1c June 2025 6.8 PSA August 2025 <0.02  Today: Overall doing extremely well Continues on Flomax  which he feels helps the urinary symptoms Using sildenafil  100 mg on demand with moderate results, interested in other options  Plan:   Prostate cancer: PSA undetectable, reassurance provided, continue yearly monitoring Urinary symptoms: Continue Flomax , avoid bladder irritants, symptoms have improved with better diabetes control ED: Trial of Cialis 20 mg as needed to replace sildenafil , can go back to sildenafil  100 mg on demand if better RTC 1 year PSA prior, PVR   Kevin JAYSON Burnet, MD  [facility] [address], [address] [place], [place] [address] [phone]

## 2024-05-23 ENCOUNTER — Ambulatory Visit: Payer: Self-pay | Admitting: Urology

## 2025-04-25 ENCOUNTER — Other Ambulatory Visit

## 2025-05-02 ENCOUNTER — Ambulatory Visit: Admitting: Radiation Oncology

## 2025-05-27 ENCOUNTER — Ambulatory Visit: Admitting: Urology
